# Patient Record
Sex: Male | Born: 1940 | Race: White | Hispanic: No | Marital: Married | Smoking: Former smoker
Health system: Southern US, Community
[De-identification: ages and names within clinical notes are randomized; demographics above are authoritative.]

## PROBLEM LIST (undated history)

## (undated) DIAGNOSIS — I10 Essential (primary) hypertension: Secondary | ICD-10-CM

## (undated) DIAGNOSIS — C801 Malignant (primary) neoplasm, unspecified: Secondary | ICD-10-CM

## (undated) DIAGNOSIS — N529 Male erectile dysfunction, unspecified: Secondary | ICD-10-CM

## (undated) DIAGNOSIS — H9190 Unspecified hearing loss, unspecified ear: Secondary | ICD-10-CM

## (undated) DIAGNOSIS — M1990 Unspecified osteoarthritis, unspecified site: Secondary | ICD-10-CM

## (undated) DIAGNOSIS — I6529 Occlusion and stenosis of unspecified carotid artery: Secondary | ICD-10-CM

## (undated) DIAGNOSIS — G4700 Insomnia, unspecified: Secondary | ICD-10-CM

## (undated) DIAGNOSIS — E785 Hyperlipidemia, unspecified: Secondary | ICD-10-CM

## (undated) DIAGNOSIS — E119 Type 2 diabetes mellitus without complications: Secondary | ICD-10-CM

## (undated) HISTORY — PX: COLONOSCOPY: SHX174

## (undated) HISTORY — PX: HEMORRHOID SURGERY: SHX153

## (undated) HISTORY — DX: Essential (primary) hypertension: I10

## (undated) HISTORY — DX: Type 2 diabetes mellitus without complications: E11.9

## (undated) HISTORY — PX: TONSILLECTOMY: SUR1361

## (undated) HISTORY — DX: Occlusion and stenosis of unspecified carotid artery: I65.29

## (undated) HISTORY — DX: Male erectile dysfunction, unspecified: N52.9

## (undated) HISTORY — DX: Hyperlipidemia, unspecified: E78.5

## (undated) HISTORY — DX: Insomnia, unspecified: G47.00

---

## 2011-04-02 DIAGNOSIS — E782 Mixed hyperlipidemia: Secondary | ICD-10-CM | POA: Diagnosis not present

## 2011-04-02 DIAGNOSIS — Z0000 Encounter for general adult medical examination without abnormal findings: Secondary | ICD-10-CM | POA: Diagnosis not present

## 2011-04-02 DIAGNOSIS — R7309 Other abnormal glucose: Secondary | ICD-10-CM | POA: Diagnosis not present

## 2011-04-02 DIAGNOSIS — I10 Essential (primary) hypertension: Secondary | ICD-10-CM | POA: Diagnosis not present

## 2011-04-02 DIAGNOSIS — N400 Enlarged prostate without lower urinary tract symptoms: Secondary | ICD-10-CM | POA: Diagnosis not present

## 2011-04-09 DIAGNOSIS — I10 Essential (primary) hypertension: Secondary | ICD-10-CM | POA: Diagnosis not present

## 2011-04-09 DIAGNOSIS — E782 Mixed hyperlipidemia: Secondary | ICD-10-CM | POA: Diagnosis not present

## 2011-04-09 DIAGNOSIS — M1990 Unspecified osteoarthritis, unspecified site: Secondary | ICD-10-CM | POA: Diagnosis not present

## 2011-04-09 DIAGNOSIS — M810 Age-related osteoporosis without current pathological fracture: Secondary | ICD-10-CM | POA: Diagnosis not present

## 2011-04-09 DIAGNOSIS — M545 Low back pain: Secondary | ICD-10-CM | POA: Diagnosis not present

## 2011-04-09 DIAGNOSIS — L2089 Other atopic dermatitis: Secondary | ICD-10-CM | POA: Diagnosis not present

## 2011-04-09 DIAGNOSIS — G4700 Insomnia, unspecified: Secondary | ICD-10-CM | POA: Diagnosis not present

## 2011-05-06 DIAGNOSIS — J209 Acute bronchitis, unspecified: Secondary | ICD-10-CM | POA: Diagnosis not present

## 2011-05-06 DIAGNOSIS — J029 Acute pharyngitis, unspecified: Secondary | ICD-10-CM | POA: Diagnosis not present

## 2011-08-04 DIAGNOSIS — E782 Mixed hyperlipidemia: Secondary | ICD-10-CM | POA: Diagnosis not present

## 2011-08-04 DIAGNOSIS — E119 Type 2 diabetes mellitus without complications: Secondary | ICD-10-CM | POA: Diagnosis not present

## 2011-08-13 DIAGNOSIS — E782 Mixed hyperlipidemia: Secondary | ICD-10-CM | POA: Diagnosis not present

## 2011-08-13 DIAGNOSIS — M810 Age-related osteoporosis without current pathological fracture: Secondary | ICD-10-CM | POA: Diagnosis not present

## 2011-08-13 DIAGNOSIS — L2089 Other atopic dermatitis: Secondary | ICD-10-CM | POA: Diagnosis not present

## 2011-08-13 DIAGNOSIS — I10 Essential (primary) hypertension: Secondary | ICD-10-CM | POA: Diagnosis not present

## 2011-08-13 DIAGNOSIS — G4700 Insomnia, unspecified: Secondary | ICD-10-CM | POA: Diagnosis not present

## 2011-08-13 DIAGNOSIS — M545 Low back pain: Secondary | ICD-10-CM | POA: Diagnosis not present

## 2011-08-13 DIAGNOSIS — M1990 Unspecified osteoarthritis, unspecified site: Secondary | ICD-10-CM | POA: Diagnosis not present

## 2011-08-14 DIAGNOSIS — L820 Inflamed seborrheic keratosis: Secondary | ICD-10-CM | POA: Diagnosis not present

## 2011-08-14 DIAGNOSIS — Z85828 Personal history of other malignant neoplasm of skin: Secondary | ICD-10-CM | POA: Diagnosis not present

## 2011-08-14 DIAGNOSIS — D210 Benign neoplasm of connective and other soft tissue of head, face and neck: Secondary | ICD-10-CM | POA: Diagnosis not present

## 2011-08-14 DIAGNOSIS — D2110 Benign neoplasm of connective and other soft tissue of unspecified upper limb, including shoulder: Secondary | ICD-10-CM | POA: Diagnosis not present

## 2011-08-14 DIAGNOSIS — L570 Actinic keratosis: Secondary | ICD-10-CM | POA: Diagnosis not present

## 2011-08-14 DIAGNOSIS — D485 Neoplasm of uncertain behavior of skin: Secondary | ICD-10-CM | POA: Diagnosis not present

## 2011-12-17 DIAGNOSIS — E781 Pure hyperglyceridemia: Secondary | ICD-10-CM | POA: Diagnosis not present

## 2011-12-17 DIAGNOSIS — E782 Mixed hyperlipidemia: Secondary | ICD-10-CM | POA: Diagnosis not present

## 2011-12-17 DIAGNOSIS — E119 Type 2 diabetes mellitus without complications: Secondary | ICD-10-CM | POA: Diagnosis not present

## 2011-12-17 DIAGNOSIS — I10 Essential (primary) hypertension: Secondary | ICD-10-CM | POA: Diagnosis not present

## 2011-12-24 DIAGNOSIS — M1990 Unspecified osteoarthritis, unspecified site: Secondary | ICD-10-CM | POA: Diagnosis not present

## 2011-12-24 DIAGNOSIS — L2089 Other atopic dermatitis: Secondary | ICD-10-CM | POA: Diagnosis not present

## 2011-12-24 DIAGNOSIS — Z23 Encounter for immunization: Secondary | ICD-10-CM | POA: Diagnosis not present

## 2011-12-24 DIAGNOSIS — E782 Mixed hyperlipidemia: Secondary | ICD-10-CM | POA: Diagnosis not present

## 2011-12-24 DIAGNOSIS — M545 Low back pain: Secondary | ICD-10-CM | POA: Diagnosis not present

## 2011-12-24 DIAGNOSIS — I10 Essential (primary) hypertension: Secondary | ICD-10-CM | POA: Diagnosis not present

## 2011-12-24 DIAGNOSIS — G4700 Insomnia, unspecified: Secondary | ICD-10-CM | POA: Diagnosis not present

## 2012-02-16 DIAGNOSIS — Z85828 Personal history of other malignant neoplasm of skin: Secondary | ICD-10-CM | POA: Diagnosis not present

## 2012-02-16 DIAGNOSIS — L570 Actinic keratosis: Secondary | ICD-10-CM | POA: Diagnosis not present

## 2012-02-16 DIAGNOSIS — C44611 Basal cell carcinoma of skin of unspecified upper limb, including shoulder: Secondary | ICD-10-CM | POA: Diagnosis not present

## 2012-02-16 DIAGNOSIS — D485 Neoplasm of uncertain behavior of skin: Secondary | ICD-10-CM | POA: Diagnosis not present

## 2012-02-29 DIAGNOSIS — C44611 Basal cell carcinoma of skin of unspecified upper limb, including shoulder: Secondary | ICD-10-CM | POA: Diagnosis not present

## 2012-02-29 DIAGNOSIS — L570 Actinic keratosis: Secondary | ICD-10-CM | POA: Diagnosis not present

## 2012-04-25 DIAGNOSIS — Z0000 Encounter for general adult medical examination without abnormal findings: Secondary | ICD-10-CM | POA: Diagnosis not present

## 2012-04-25 DIAGNOSIS — E782 Mixed hyperlipidemia: Secondary | ICD-10-CM | POA: Diagnosis not present

## 2012-04-25 DIAGNOSIS — G4700 Insomnia, unspecified: Secondary | ICD-10-CM | POA: Diagnosis not present

## 2012-04-25 DIAGNOSIS — I10 Essential (primary) hypertension: Secondary | ICD-10-CM | POA: Diagnosis not present

## 2012-04-25 DIAGNOSIS — E119 Type 2 diabetes mellitus without complications: Secondary | ICD-10-CM | POA: Diagnosis not present

## 2012-08-16 DIAGNOSIS — D485 Neoplasm of uncertain behavior of skin: Secondary | ICD-10-CM | POA: Diagnosis not present

## 2012-08-16 DIAGNOSIS — Z85828 Personal history of other malignant neoplasm of skin: Secondary | ICD-10-CM | POA: Diagnosis not present

## 2012-08-16 DIAGNOSIS — L570 Actinic keratosis: Secondary | ICD-10-CM | POA: Diagnosis not present

## 2012-08-16 DIAGNOSIS — D2320 Other benign neoplasm of skin of unspecified ear and external auricular canal: Secondary | ICD-10-CM | POA: Diagnosis not present

## 2012-08-16 DIAGNOSIS — D234 Other benign neoplasm of skin of scalp and neck: Secondary | ICD-10-CM | POA: Diagnosis not present

## 2012-08-23 DIAGNOSIS — I10 Essential (primary) hypertension: Secondary | ICD-10-CM | POA: Diagnosis not present

## 2012-08-23 DIAGNOSIS — E782 Mixed hyperlipidemia: Secondary | ICD-10-CM | POA: Diagnosis not present

## 2012-08-23 DIAGNOSIS — E119 Type 2 diabetes mellitus without complications: Secondary | ICD-10-CM | POA: Diagnosis not present

## 2012-08-30 DIAGNOSIS — M1990 Unspecified osteoarthritis, unspecified site: Secondary | ICD-10-CM | POA: Diagnosis not present

## 2012-08-30 DIAGNOSIS — G4700 Insomnia, unspecified: Secondary | ICD-10-CM | POA: Diagnosis not present

## 2012-08-30 DIAGNOSIS — L2089 Other atopic dermatitis: Secondary | ICD-10-CM | POA: Diagnosis not present

## 2012-08-30 DIAGNOSIS — L570 Actinic keratosis: Secondary | ICD-10-CM | POA: Diagnosis not present

## 2012-08-30 DIAGNOSIS — M545 Low back pain: Secondary | ICD-10-CM | POA: Diagnosis not present

## 2012-08-30 DIAGNOSIS — E782 Mixed hyperlipidemia: Secondary | ICD-10-CM | POA: Diagnosis not present

## 2012-08-30 DIAGNOSIS — I10 Essential (primary) hypertension: Secondary | ICD-10-CM | POA: Diagnosis not present

## 2012-09-15 DIAGNOSIS — M19079 Primary osteoarthritis, unspecified ankle and foot: Secondary | ICD-10-CM | POA: Diagnosis not present

## 2012-09-15 DIAGNOSIS — M7730 Calcaneal spur, unspecified foot: Secondary | ICD-10-CM | POA: Diagnosis not present

## 2012-09-15 DIAGNOSIS — M25473 Effusion, unspecified ankle: Secondary | ICD-10-CM | POA: Diagnosis not present

## 2012-09-15 DIAGNOSIS — S93409A Sprain of unspecified ligament of unspecified ankle, initial encounter: Secondary | ICD-10-CM | POA: Diagnosis not present

## 2012-10-31 DIAGNOSIS — M899 Disorder of bone, unspecified: Secondary | ICD-10-CM | POA: Diagnosis not present

## 2012-10-31 DIAGNOSIS — Z87891 Personal history of nicotine dependence: Secondary | ICD-10-CM | POA: Diagnosis not present

## 2012-10-31 DIAGNOSIS — Z8262 Family history of osteoporosis: Secondary | ICD-10-CM | POA: Diagnosis not present

## 2012-12-16 DIAGNOSIS — Z23 Encounter for immunization: Secondary | ICD-10-CM | POA: Diagnosis not present

## 2012-12-28 DIAGNOSIS — I10 Essential (primary) hypertension: Secondary | ICD-10-CM | POA: Diagnosis not present

## 2012-12-28 DIAGNOSIS — E782 Mixed hyperlipidemia: Secondary | ICD-10-CM | POA: Diagnosis not present

## 2013-01-05 DIAGNOSIS — G4700 Insomnia, unspecified: Secondary | ICD-10-CM | POA: Diagnosis not present

## 2013-01-05 DIAGNOSIS — I10 Essential (primary) hypertension: Secondary | ICD-10-CM | POA: Diagnosis not present

## 2013-01-05 DIAGNOSIS — R0989 Other specified symptoms and signs involving the circulatory and respiratory systems: Secondary | ICD-10-CM | POA: Diagnosis not present

## 2013-01-05 DIAGNOSIS — I658 Occlusion and stenosis of other precerebral arteries: Secondary | ICD-10-CM | POA: Diagnosis not present

## 2013-01-05 DIAGNOSIS — M1990 Unspecified osteoarthritis, unspecified site: Secondary | ICD-10-CM | POA: Diagnosis not present

## 2013-01-05 DIAGNOSIS — Z1331 Encounter for screening for depression: Secondary | ICD-10-CM | POA: Diagnosis not present

## 2013-01-05 DIAGNOSIS — E782 Mixed hyperlipidemia: Secondary | ICD-10-CM | POA: Diagnosis not present

## 2013-01-05 DIAGNOSIS — M545 Low back pain: Secondary | ICD-10-CM | POA: Diagnosis not present

## 2013-01-05 DIAGNOSIS — L2089 Other atopic dermatitis: Secondary | ICD-10-CM | POA: Diagnosis not present

## 2013-01-10 ENCOUNTER — Other Ambulatory Visit: Payer: Self-pay | Admitting: *Deleted

## 2013-01-11 ENCOUNTER — Encounter: Payer: Self-pay | Admitting: Vascular Surgery

## 2013-01-18 ENCOUNTER — Encounter: Payer: Self-pay | Admitting: Vascular Surgery

## 2013-01-19 ENCOUNTER — Encounter (INDEPENDENT_AMBULATORY_CARE_PROVIDER_SITE_OTHER): Payer: Self-pay

## 2013-01-19 ENCOUNTER — Ambulatory Visit (HOSPITAL_COMMUNITY)
Admission: RE | Admit: 2013-01-19 | Discharge: 2013-01-19 | Disposition: A | Discharge status: Home / Self Care | Payer: Medicare Other | Source: Ambulatory Visit | Attending: Vascular Surgery | Admitting: Vascular Surgery

## 2013-01-19 ENCOUNTER — Ambulatory Visit (INDEPENDENT_AMBULATORY_CARE_PROVIDER_SITE_OTHER): Payer: Medicare Other | Admitting: Vascular Surgery

## 2013-01-19 ENCOUNTER — Encounter: Payer: Self-pay | Admitting: Vascular Surgery

## 2013-01-19 DIAGNOSIS — I6529 Occlusion and stenosis of unspecified carotid artery: Secondary | ICD-10-CM | POA: Diagnosis not present

## 2013-01-19 DIAGNOSIS — Z01810 Encounter for preprocedural cardiovascular examination: Secondary | ICD-10-CM | POA: Diagnosis not present

## 2013-01-19 NOTE — Progress Notes (Signed)
History of Present Illness:  Patient is a 72 y.o. year old male who presents for evaluation of carotid stenosis.  The patient denies symptoms of TIA, amaurosis, or stroke.  The patient is currently on aspirin antiplatelet therapy.  He is also on a statin.  The carotid stenosis was found on routine surveillance ultrasound.  Other medical problems include hypertension, diabetes, hyperlipidemia.  These are currently stable. He is not on medication for his hypertension. He is also not on medication for his diabetes.  Past Medical History  Diagnosis Date  . Carotid artery occlusion   . Diabetes mellitus without complication   . Hypertension   . Erectile dysfunction   . Hyperlipidemia   . Insomnia     Past Surgical History  Procedure Laterality Date  . Tonsillectomy    . Hemorrhoid surgery       Social History History  Substance Use Topics  . Smoking status: Former Smoker    Quit date: 01/19/1993  . Smokeless tobacco: Not on file  . Alcohol Use: 9.6 - 12 oz/week    2-3 Glasses of wine, 2-3 Cans of beer, 12-14 Shots of liquor per week    Family History Family History  Problem Relation Age of Onset  . Diabetes Father   . Heart disease Father   . Hyperlipidemia Father   . Heart attack Father     Allergies  Allergies  Allergen Reactions  . Pneumococcal Vaccines Other (See Comments)    myalgias  . Bupropion Palpitations     Current Outpatient Prescriptions  Medication Sig Dispense Refill  . alendronate (FOSAMAX) 70 MG tablet Take 70 mg by mouth once a week. Take with a full glass of water on an empty stomach.      . aspirin 81 MG tablet Take 81 mg by mouth daily.      . lovastatin (MEVACOR) 20 MG tablet Take 20 mg by mouth at bedtime. Take two tablets QHS      . Multiple Vitamins-Minerals (MEGA MULTIVITAMIN FOR MEN PO) Take 1 tablet by mouth daily.      . sildenafil (VIAGRA) 100 MG tablet Take 100 mg by mouth daily as needed for erectile dysfunction.      . zolpidem  (AMBIEN) 10 MG tablet Take 10 mg by mouth at bedtime as needed for sleep.      . halobetasol (ULTRAVATE) 0.05 % cream Apply 1 application topically 2 (two) times daily.       No current facility-administered medications for this visit.    ROS:   12 point review of systems all negative  Physical Examination  Filed Vitals:   01/19/13 1457 01/19/13 1501  BP: 189/71 181/71  Pulse: 65   Height: 5' 9" (1.753 m)   Weight: 175 lb 8 oz (79.606 kg)   SpO2: 100%     Body mass index is 25.9 kg/(m^2).  General:  Alert and oriented, no acute distress HEENT: Normal Neck: Right-sided carotid bruit, no left bruit or JVD Pulmonary: Clear to auscultation bilaterally Cardiac: Regular Rate and Rhythm without murmur Gastrointestinal: Soft, non-tender, non-distended, no mass Skin: No rash Extremity Pulses:  2+ radial, brachial, femoral bilaterally Musculoskeletal: No deformity or edema  Neurologic: Upper and lower extremity motor 5/5 and symmetric, no facial asymmetry  DATA: I reviewed the patient's carotid duplex exam from inside imaging dated October 30. This shows a high-grade greater than 80% right internal carotid artery stenosis and less than 50% left internal carotid artery stenosis. We also repeated his duplex   exam in the right side for operative planning purposes in our office today. This confirmed a greater than 80% right internal carotid artery stenosis with bifurcation at the mid hyoid level. I reviewed and interpreted this study.   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 He may need to be started on blood pressure medication if his old pressure is truly running in the 180 systolic. #2 continue aspirin daily including the operation #3 right carotid endarterectomy in the next few weeks pending results of a cardiac stress test. Risks benefits possible  complications and procedure details of carotid endarterectomy were explained the patient and his wife today. These include but are not limited to bleeding infection cranial nerve injury stroke risk of 1-2% also compared to medical management stroke reduction 2-3 fold  Jerome Otter, MD [facility] [place] Office: [phone] Pager: [phone]    Bhakti Labella, MD [facility] [place] Office: [phone] Pager: [phone]   For VQI Use Only    PRE-ADM LIVING: Home  AMB STATUS: Ambulatory  CAD Sx: None  PRIOR CHF: None  STRESS TEST: [ ] No, [ ] Normal, [ ] + ischemia, [ ] + MI, [ ] Both  

## 2013-01-20 ENCOUNTER — Encounter (HOSPITAL_COMMUNITY): Payer: Self-pay | Admitting: Pharmacy Technician

## 2013-01-20 ENCOUNTER — Other Ambulatory Visit: Payer: Self-pay

## 2013-01-20 ENCOUNTER — Encounter: Payer: Self-pay | Admitting: Family Medicine

## 2013-01-23 ENCOUNTER — Ambulatory Visit (HOSPITAL_COMMUNITY): Payer: Medicare Other | Attending: Vascular Surgery | Admitting: Radiology

## 2013-01-23 DIAGNOSIS — E785 Hyperlipidemia, unspecified: Secondary | ICD-10-CM | POA: Insufficient documentation

## 2013-01-23 DIAGNOSIS — I491 Atrial premature depolarization: Secondary | ICD-10-CM | POA: Diagnosis not present

## 2013-01-23 DIAGNOSIS — E119 Type 2 diabetes mellitus without complications: Secondary | ICD-10-CM | POA: Insufficient documentation

## 2013-01-23 DIAGNOSIS — Z87891 Personal history of nicotine dependence: Secondary | ICD-10-CM | POA: Insufficient documentation

## 2013-01-23 DIAGNOSIS — I10 Essential (primary) hypertension: Secondary | ICD-10-CM | POA: Diagnosis not present

## 2013-01-23 DIAGNOSIS — R002 Palpitations: Secondary | ICD-10-CM | POA: Diagnosis not present

## 2013-01-23 DIAGNOSIS — Z8249 Family history of ischemic heart disease and other diseases of the circulatory system: Secondary | ICD-10-CM | POA: Diagnosis not present

## 2013-01-23 DIAGNOSIS — I779 Disorder of arteries and arterioles, unspecified: Secondary | ICD-10-CM | POA: Insufficient documentation

## 2013-01-23 DIAGNOSIS — Z01810 Encounter for preprocedural cardiovascular examination: Secondary | ICD-10-CM

## 2013-01-23 MED ORDER — TECHNETIUM TC 99M SESTAMIBI GENERIC - CARDIOLITE
10.0000 | Freq: Once | INTRAVENOUS | Status: AC | PRN
  Administered 2013-01-23: 10 via INTRAVENOUS

## 2013-01-23 MED ORDER — TECHNETIUM TC 99M SESTAMIBI GENERIC - CARDIOLITE
30.0000 | Freq: Once | INTRAVENOUS | Status: AC | PRN
  Administered 2013-01-23: 30 via INTRAVENOUS

## 2013-01-23 MED ORDER — REGADENOSON 0.4 MG/5ML IV SOLN
0.4000 mg | Freq: Once | INTRAVENOUS | Status: AC
  Administered 2013-01-23: 0.4 mg via INTRAVENOUS

## 2013-01-23 NOTE — Progress Notes (Signed)
[facility] SITE 3 NUCLEAR MED [address] [place], [place] [address] [phone]    Cardiology Nuclear Med Study  Terrance Brown is a 72 y.o. male     MRN : [id number]     DOB: 29-May-1940  Procedure Date: 01/23/2013  Nuclear Med Background Indication for Stress Test:  Evaluation for Ischemia and Surgical Clearance- 01/30/13 R [facility]- Dr. Fabienne Bruns History:  GXT: NL Cardiac Risk Factors: Carotid Disease, Family History - CAD, History of Smoking, Hypertension, Lipids and NIDDM  Symptoms:  Palpitations   Nuclear Pre-Procedure Caffeine/Decaff Intake:  None NPO After: 9:00pm   Lungs:  clear O2 Sat: 96% on room air. IV 0.9% NS with Angio Cath:  22g  IV Site: L Hand  IV Started by:  Bonnita Levan, RN  Chest Size (in):  42 Cup Size: n/a  Height: 5\' 9"  (1.753 m)  Weight:  174 lb (78.926 kg)  BMI:  Body mass index is 25.68 kg/(m^2). Tech Comments:  N/A    Nuclear Med Study 1 or 2 day study: 1 day  Stress Test Type:  Lexiscan  Reading MD: Tobias Alexander, MD  Order Authorizing Provider:  Fabienne Bruns, MD  Resting Radionuclide: Technetium 19m Sestamibi  Resting Radionuclide Dose: 11.0 mCi   Stress Radionuclide:  Technetium 17m Sestamibi  Stress Radionuclide Dose: 33.0 mCi           Stress Protocol Rest HR: 59 Stress HR: 84  Rest BP: 158/81 Stress BP: 190/65  Exercise Time (min): n/a METS: n/a   Predicted Max HR: 146 bpm % Max HR: 57.53 bpm Rate Pressure Product: 21308   Dose of Adenosine (mg):  n/a Dose of Lexiscan: 0.4 mg  Dose of Atropine (mg): n/a Dose of Dobutamine: n/a mcg/kg/min (at max HR)  Stress Test Technologist: Milana Na, EMT-P  Nuclear Technologist:  Domenic Polite, CNMT     Rest Procedure:  Myocardial perfusion imaging was performed at rest 45 minutes following the intravenous administration of Technetium 68m Sestamibi. Rest ECG: Sinus bradycardia, PACs  Stress Procedure:  The patient received IV Lexiscan 0.4 mg over 15-seconds.   Technetium 66m Sestamibi injected at 30-seconds. This patient had sob and abdominal pain with the Lexiscan injection. Quantitative spect images were obtained after a 45 minute delay. Stress ECG: No significant change from baseline ECG  QPS Raw Data Images:  Normal; no motion artifact; normal heart/lung ratio. Stress Images:  Normal homogeneous uptake in all areas of the myocardium. Rest Images:  Normal homogeneous uptake in all areas of the myocardium. Subtraction (SDS):  No evidence of ischemia. Transient Ischemic Dilatation (Normal <1.22):  0.97 Lung/Heart Ratio (Normal <0.45):  0.25  Quantitative Gated Spect Images QGS EDV:  89 ml QGS ESV:  35 ml  Impression Exercise Capacity:  Lexiscan with no exercise. BP Response:  Hypertensive blood pressure response. Clinical Symptoms:  There is dyspnea. ECG Impression:  No significant ST segment change suggestive of ischemia. Comparison with Prior Nuclear Study: No previous nuclear study performed  Overall Impression:  Normal stress nuclear study.  LV Ejection Fraction: 61%.  LV Wall Motion:  NL LV Function; NL Wall Motion  Tobias Alexander, Rexene Edison 01/23/2013

## 2013-01-25 ENCOUNTER — Encounter (HOSPITAL_COMMUNITY)
Admission: RE | Admit: 2013-01-25 | Discharge: 2013-01-25 | Disposition: A | Discharge status: Home / Self Care | Payer: Medicare Other | Source: Ambulatory Visit | Attending: Vascular Surgery | Admitting: Vascular Surgery

## 2013-01-25 ENCOUNTER — Encounter (HOSPITAL_COMMUNITY): Payer: Self-pay

## 2013-01-25 DIAGNOSIS — Z01810 Encounter for preprocedural cardiovascular examination: Secondary | ICD-10-CM | POA: Insufficient documentation

## 2013-01-25 DIAGNOSIS — Z01812 Encounter for preprocedural laboratory examination: Secondary | ICD-10-CM | POA: Diagnosis not present

## 2013-01-25 DIAGNOSIS — Z01818 Encounter for other preprocedural examination: Secondary | ICD-10-CM | POA: Insufficient documentation

## 2013-01-25 DIAGNOSIS — I10 Essential (primary) hypertension: Secondary | ICD-10-CM | POA: Diagnosis not present

## 2013-01-25 HISTORY — DX: Malignant (primary) neoplasm, unspecified: C80.1

## 2013-01-25 HISTORY — DX: Unspecified hearing loss, unspecified ear: H91.90

## 2013-01-25 HISTORY — DX: Unspecified osteoarthritis, unspecified site: M19.90

## 2013-01-25 LAB — TYPE AND SCREEN
ABO/RH(D): O POS
Antibody Screen: NEGATIVE

## 2013-01-25 LAB — COMPREHENSIVE METABOLIC PANEL
ALT: 21 U/L (ref 0–53)
AST: 24 U/L (ref 0–37)
Alkaline Phosphatase: 66 U/L (ref 39–117)
CO2: 26 mEq/L (ref 19–32)
Calcium: 9.5 mg/dL (ref 8.4–10.5)
Chloride: 105 mEq/L (ref 96–112)
Glucose, Bld: 122 mg/dL — ABNORMAL HIGH (ref 70–99)
Potassium: 4.8 mEq/L (ref 3.5–5.1)
Sodium: 141 mEq/L (ref 135–145)
Total Protein: 7.3 g/dL (ref 6.0–8.3)

## 2013-01-25 LAB — URINALYSIS, ROUTINE W REFLEX MICROSCOPIC
Leukocytes, UA: NEGATIVE
Nitrite: NEGATIVE
Protein, ur: NEGATIVE mg/dL
Specific Gravity, Urine: 1.026 (ref 1.005–1.030)
Urobilinogen, UA: 1 mg/dL (ref 0.0–1.0)

## 2013-01-25 LAB — CBC
Hemoglobin: 14.7 g/dL (ref 13.0–17.0)
MCH: 31.8 pg (ref 26.0–34.0)
MCHC: 34.4 g/dL (ref 30.0–36.0)
Platelets: 141 10*3/uL — ABNORMAL LOW (ref 150–400)
RBC: 4.62 MIL/uL (ref 4.22–5.81)
RDW: 12.4 % (ref 11.5–15.5)

## 2013-01-25 LAB — ABO/RH: ABO/RH(D): O POS

## 2013-01-25 LAB — SURGICAL PCR SCREEN
MRSA, PCR: NEGATIVE
Staphylococcus aureus: NEGATIVE

## 2013-01-25 LAB — APTT: aPTT: 31 seconds (ref 24–37)

## 2013-01-25 NOTE — Pre-Procedure Instructions (Signed)
Terrance Brown  01/25/2013   Your procedure is scheduled on:  Monday January 30, 2013.  Report to [facility] Short Stay Entrance "A" Admitting at 5:30 AM.  Call this number if you have problems the morning of surgery: [phone]   Remember:   Do not eat food or drink liquids after midnight.   Take these medicines the morning of surgery with A SIP OF WATER: NONE   Do not wear jewelry.  Do not wear lotions, powders, or colognes. You may wear deodorant.  Men may shave face and neck.  Do not bring valuables to the hospital.  [facility] is not responsible for any belongings or valuables.               Contacts, dentures or bridgework may not be worn into surgery.  Leave suitcase in the car. After surgery it may be brought to your room.  For patients admitted to the hospital, discharge time is determined by your treatment team.               Patients discharged the day of surgery will not be allowed to drive home.  Name and phone number of your driver:   Special Instructions: Shower using CHG 2 nights before surgery and the night before surgery.  If you shower the day of surgery use CHG.  Use special wash - you have one bottle of CHG for all showers.  You should use approximately 1/3 of the bottle for each shower.   Please read over the following fact sheets that you were given: Pain Booklet, Coughing and Deep Breathing, Blood Transfusion Information, MRSA Information and Surgical Site Infection Prevention

## 2013-01-25 NOTE — Progress Notes (Signed)
Patient denied having any cardiac or pulmonary issues. Patient had a stress test on 01/19/13; encounter in [facility]. Patient stated "since I was having surgery my doctor thought it be best that I have a stress test first." PCP is Dr. Donzetta Sprung in [place], [place]. Patient informed Nurse that he has been having a elevated blood pressure, however he has not been prescribed to take any medication for it. Wife at chair side during PAT.

## 2013-01-29 MED ORDER — DEXTROSE 5 % IV SOLN
1.5000 g | INTRAVENOUS | Status: AC
  Administered 2013-01-30: 1.5 g via INTRAVENOUS
  Filled 2013-01-29: qty 1.5

## 2013-01-30 ENCOUNTER — Telehealth: Payer: Self-pay | Admitting: Vascular Surgery

## 2013-01-30 ENCOUNTER — Encounter (HOSPITAL_COMMUNITY): Payer: Medicare Other | Admitting: Critical Care Medicine

## 2013-01-30 ENCOUNTER — Encounter (HOSPITAL_COMMUNITY)
Admission: RE | Disposition: A | Discharge status: Home / Self Care | Payer: Self-pay | Source: Ambulatory Visit | Attending: Vascular Surgery

## 2013-01-30 ENCOUNTER — Inpatient Hospital Stay (HOSPITAL_COMMUNITY): Payer: Medicare Other | Admitting: Critical Care Medicine

## 2013-01-30 ENCOUNTER — Inpatient Hospital Stay (HOSPITAL_COMMUNITY)
Admission: RE | Admit: 2013-01-30 | Discharge: 2013-01-31 | DRG: 039 | Disposition: A | Discharge status: Home / Self Care | Payer: Medicare Other | Source: Ambulatory Visit | Attending: Vascular Surgery | Admitting: Vascular Surgery

## 2013-01-30 ENCOUNTER — Encounter (HOSPITAL_COMMUNITY): Payer: Self-pay | Admitting: Critical Care Medicine

## 2013-01-30 DIAGNOSIS — Z7982 Long term (current) use of aspirin: Secondary | ICD-10-CM

## 2013-01-30 DIAGNOSIS — Z833 Family history of diabetes mellitus: Secondary | ICD-10-CM | POA: Diagnosis not present

## 2013-01-30 DIAGNOSIS — I658 Occlusion and stenosis of other precerebral arteries: Secondary | ICD-10-CM | POA: Diagnosis not present

## 2013-01-30 DIAGNOSIS — H9190 Unspecified hearing loss, unspecified ear: Secondary | ICD-10-CM | POA: Diagnosis present

## 2013-01-30 DIAGNOSIS — I6529 Occlusion and stenosis of unspecified carotid artery: Secondary | ICD-10-CM | POA: Diagnosis not present

## 2013-01-30 DIAGNOSIS — Z87891 Personal history of nicotine dependence: Secondary | ICD-10-CM

## 2013-01-30 DIAGNOSIS — I10 Essential (primary) hypertension: Secondary | ICD-10-CM | POA: Diagnosis not present

## 2013-01-30 DIAGNOSIS — E119 Type 2 diabetes mellitus without complications: Secondary | ICD-10-CM | POA: Diagnosis present

## 2013-01-30 DIAGNOSIS — M129 Arthropathy, unspecified: Secondary | ICD-10-CM | POA: Diagnosis present

## 2013-01-30 DIAGNOSIS — E785 Hyperlipidemia, unspecified: Secondary | ICD-10-CM | POA: Diagnosis present

## 2013-01-30 HISTORY — PX: ENDARTERECTOMY: SHX5162

## 2013-01-30 HISTORY — PX: PATCH ANGIOPLASTY: SHX6230

## 2013-01-30 LAB — GLUCOSE, CAPILLARY
Glucose-Capillary: 130 mg/dL — ABNORMAL HIGH (ref 70–99)
Glucose-Capillary: 135 mg/dL — ABNORMAL HIGH (ref 70–99)
Glucose-Capillary: 114 mg/dL — ABNORMAL HIGH (ref 70–99)
Glucose-Capillary: 130 mg/dL — ABNORMAL HIGH (ref 70–99)
Glucose-Capillary: 154 mg/dL — ABNORMAL HIGH (ref 70–99)

## 2013-01-30 SURGERY — ENDARTERECTOMY, CAROTID
Anesthesia: General | Site: Neck | Laterality: Right | Wound class: Clean

## 2013-01-30 MED ORDER — FENTANYL CITRATE 0.05 MG/ML IJ SOLN
INTRAMUSCULAR | Status: DC | PRN
  Administered 2013-01-30 (×4): 50 ug via INTRAVENOUS
  Administered 2013-01-30: 100 ug via INTRAVENOUS

## 2013-01-30 MED ORDER — MORPHINE SULFATE 2 MG/ML IJ SOLN
2.0000 mg | INTRAMUSCULAR | Status: DC | PRN
  Administered 2013-01-30: 2 mg via INTRAVENOUS
  Filled 2013-01-30: qty 1

## 2013-01-30 MED ORDER — NEOSTIGMINE METHYLSULFATE 1 MG/ML IJ SOLN
INTRAMUSCULAR | Status: DC | PRN
  Administered 2013-01-30: 3 mg via INTRAVENOUS

## 2013-01-30 MED ORDER — HEPARIN SODIUM (PORCINE) 1000 UNIT/ML IJ SOLN
INTRAMUSCULAR | Status: DC | PRN
  Administered 2013-01-30: 8000 [IU] via INTRAVENOUS

## 2013-01-30 MED ORDER — ASPIRIN EC 81 MG PO TBEC
81.0000 mg | DELAYED_RELEASE_TABLET | Freq: Every day | ORAL | Status: DC
  Administered 2013-01-31: 81 mg via ORAL
  Filled 2013-01-30: qty 1

## 2013-01-30 MED ORDER — MIDAZOLAM HCL 5 MG/5ML IJ SOLN
INTRAMUSCULAR | Status: DC | PRN
  Administered 2013-01-30: 1 mg via INTRAVENOUS

## 2013-01-30 MED ORDER — DEXTROSE 5 % IV SOLN
1.5000 g | Freq: Two times a day (BID) | INTRAVENOUS | Status: AC
  Administered 2013-01-30 – 2013-01-31 (×2): 1.5 g via INTRAVENOUS
  Filled 2013-01-30 (×2): qty 1.5

## 2013-01-30 MED ORDER — SODIUM CHLORIDE 0.9 % IR SOLN
Status: DC | PRN
  Administered 2013-01-30: 08:00:00

## 2013-01-30 MED ORDER — OXYCODONE-ACETAMINOPHEN 5-325 MG PO TABS: 1.0000 | ORAL_TABLET | ORAL | Status: AC | PRN

## 2013-01-30 MED ORDER — LIDOCAINE HCL (CARDIAC) 20 MG/ML IV SOLN
INTRAVENOUS | Status: DC | PRN
  Administered 2013-01-30: 50 mg via INTRAVENOUS

## 2013-01-30 MED ORDER — SODIUM CHLORIDE 0.9 % IV SOLN: INTRAVENOUS | Status: DC

## 2013-01-30 MED ORDER — LACTATED RINGERS IV SOLN
INTRAVENOUS | Status: DC | PRN
  Administered 2013-01-30 (×2): via INTRAVENOUS

## 2013-01-30 MED ORDER — PROPOFOL 10 MG/ML IV BOLUS
INTRAVENOUS | Status: DC | PRN
  Administered 2013-01-30: 140 mg via INTRAVENOUS

## 2013-01-30 MED ORDER — HYDROMORPHONE HCL PF 1 MG/ML IJ SOLN: 0.2500 mg | INTRAMUSCULAR | Status: DC | PRN

## 2013-01-30 MED ORDER — HYDRALAZINE HCL 20 MG/ML IJ SOLN: 10.0000 mg | INTRAMUSCULAR | Status: DC | PRN

## 2013-01-30 MED ORDER — OXYCODONE-ACETAMINOPHEN 5-325 MG PO TABS
1.0000 | ORAL_TABLET | ORAL | Status: DC | PRN
  Administered 2013-01-31: 1 via ORAL
  Filled 2013-01-30: qty 1

## 2013-01-30 MED ORDER — DEXTROSE-NACL 5-0.9 % IV SOLN: INTRAVENOUS | Status: DC

## 2013-01-30 MED ORDER — LACTATED RINGERS IV SOLN: INTRAVENOUS | Status: DC | PRN

## 2013-01-30 MED ORDER — ROCURONIUM BROMIDE 100 MG/10ML IV SOLN
INTRAVENOUS | Status: DC | PRN
  Administered 2013-01-30: 10 mg via INTRAVENOUS
  Administered 2013-01-30: 40 mg via INTRAVENOUS

## 2013-01-30 MED ORDER — SIMVASTATIN 10 MG PO TABS
10.0000 mg | ORAL_TABLET | Freq: Every day | ORAL | Status: DC
  Administered 2013-01-30: 10 mg via ORAL
  Filled 2013-01-30 (×2): qty 1

## 2013-01-30 MED ORDER — ALUM & MAG HYDROXIDE-SIMETH 200-200-20 MG/5ML PO SUSP: 15.0000 mL | ORAL | Status: DC | PRN

## 2013-01-30 MED ORDER — ONDANSETRON HCL 4 MG/2ML IJ SOLN
INTRAMUSCULAR | Status: DC | PRN
  Administered 2013-01-30: 4 mg via INTRAVENOUS

## 2013-01-30 MED ORDER — SODIUM CHLORIDE 0.9 % IV SOLN
INTRAVENOUS | Status: DC
  Administered 2013-01-30 (×2): via INTRAVENOUS

## 2013-01-30 MED ORDER — PHENOL 1.4 % MT LIQD: 1.0000 | OROMUCOSAL | Status: DC | PRN

## 2013-01-30 MED ORDER — ACETAMINOPHEN 650 MG RE SUPP: 325.0000 mg | RECTAL | Status: DC | PRN

## 2013-01-30 MED ORDER — PROTAMINE SULFATE 10 MG/ML IV SOLN
INTRAVENOUS | Status: DC | PRN
  Administered 2013-01-30: 80 mg via INTRAVENOUS

## 2013-01-30 MED ORDER — CYCLOBENZAPRINE HCL 10 MG PO TABS
10.0000 mg | ORAL_TABLET | Freq: Three times a day (TID) | ORAL | Status: DC | PRN
  Administered 2013-01-31: 10 mg via ORAL
  Filled 2013-01-30: qty 1

## 2013-01-30 MED ORDER — POTASSIUM CHLORIDE CRYS ER 20 MEQ PO TBCR: 20.0000 meq | EXTENDED_RELEASE_TABLET | Freq: Once | ORAL | Status: AC | PRN

## 2013-01-30 MED ORDER — THROMBIN 20000 UNITS EX SOLR
CUTANEOUS | Status: AC
  Filled 2013-01-30: qty 20000

## 2013-01-30 MED ORDER — ONDANSETRON HCL 4 MG/2ML IJ SOLN: 4.0000 mg | Freq: Once | INTRAMUSCULAR | Status: DC | PRN

## 2013-01-30 MED ORDER — GLYCOPYRROLATE 0.2 MG/ML IJ SOLN
INTRAMUSCULAR | Status: DC | PRN
  Administered 2013-01-30: .4 mg via INTRAVENOUS

## 2013-01-30 MED ORDER — PANTOPRAZOLE SODIUM 40 MG PO TBEC
40.0000 mg | DELAYED_RELEASE_TABLET | Freq: Every day | ORAL | Status: DC
  Administered 2013-01-30 – 2013-01-31 (×2): 40 mg via ORAL
  Filled 2013-01-30 (×2): qty 1

## 2013-01-30 MED ORDER — ACETAMINOPHEN 325 MG PO TABS: 325.0000 mg | ORAL_TABLET | ORAL | Status: DC | PRN

## 2013-01-30 MED ORDER — GUAIFENESIN-DM 100-10 MG/5ML PO SYRP: 15.0000 mL | ORAL_SOLUTION | ORAL | Status: DC | PRN

## 2013-01-30 MED ORDER — LIDOCAINE HCL (PF) 1 % IJ SOLN
INTRAMUSCULAR | Status: AC
  Filled 2013-01-30: qty 30

## 2013-01-30 MED ORDER — DOCUSATE SODIUM 100 MG PO CAPS
100.0000 mg | ORAL_CAPSULE | Freq: Every day | ORAL | Status: DC
  Administered 2013-01-31: 100 mg via ORAL
  Filled 2013-01-30: qty 1

## 2013-01-30 MED ORDER — METOPROLOL TARTRATE 1 MG/ML IV SOLN: 2.0000 mg | INTRAVENOUS | Status: DC | PRN

## 2013-01-30 MED ORDER — ONDANSETRON HCL 4 MG/2ML IJ SOLN: 4.0000 mg | Freq: Four times a day (QID) | INTRAMUSCULAR | Status: DC | PRN

## 2013-01-30 MED ORDER — PHENYLEPHRINE HCL 10 MG/ML IJ SOLN
10.0000 mg | INTRAVENOUS | Status: DC | PRN
  Administered 2013-01-30: 25 ug/min via INTRAVENOUS

## 2013-01-30 MED ORDER — LABETALOL HCL 5 MG/ML IV SOLN: 10.0000 mg | INTRAVENOUS | Status: DC | PRN

## 2013-01-30 MED ORDER — EPHEDRINE SULFATE 50 MG/ML IJ SOLN
INTRAMUSCULAR | Status: DC | PRN
  Administered 2013-01-30: 2.5 mg via INTRAVENOUS
  Administered 2013-01-30: 10 mg via INTRAVENOUS

## 2013-01-30 MED ORDER — SODIUM CHLORIDE 0.9 % IV SOLN
500.0000 mL | Freq: Once | INTRAVENOUS | Status: AC | PRN
  Administered 2013-01-30: 500 mL via INTRAVENOUS

## 2013-01-30 MED ORDER — ARTIFICIAL TEARS OP OINT
TOPICAL_OINTMENT | OPHTHALMIC | Status: DC | PRN
  Administered 2013-01-30: 1 via OPHTHALMIC

## 2013-01-30 MED ORDER — 0.9 % SODIUM CHLORIDE (POUR BTL) OPTIME
TOPICAL | Status: DC | PRN
  Administered 2013-01-30: 2000 mL

## 2013-01-30 MED ORDER — LABETALOL HCL 5 MG/ML IV SOLN
INTRAVENOUS | Status: DC | PRN
  Administered 2013-01-30: 5 mg via INTRAVENOUS

## 2013-01-30 SURGICAL SUPPLY — 50 items
CANISTER SUCTION 2500CC (MISCELLANEOUS) ×3 IMPLANT
CANNULA VESSEL 3MM 2 BLNT TIP (CANNULA) ×3 IMPLANT
CATH ROBINSON RED A/P 18FR (CATHETERS) ×3 IMPLANT
CLIP TI MEDIUM 6 (CLIP) ×3 IMPLANT
CLIP TI WIDE RED SMALL 6 (CLIP) ×3 IMPLANT
COVER SURGICAL LIGHT HANDLE (MISCELLANEOUS) ×3 IMPLANT
CRADLE DONUT ADULT HEAD (MISCELLANEOUS) ×3 IMPLANT
DECANTER SPIKE VIAL GLASS SM (MISCELLANEOUS) IMPLANT
DERMABOND ADVANCED (GAUZE/BANDAGES/DRESSINGS) ×1
DERMABOND ADVANCED .7 DNX12 (GAUZE/BANDAGES/DRESSINGS) ×2 IMPLANT
DRAIN HEMOVAC 1/8 X 5 (WOUND CARE) IMPLANT
DRAPE WARM FLUID 44X44 (DRAPE) ×3 IMPLANT
ELECT REM PT RETURN 9FT ADLT (ELECTROSURGICAL) ×3
ELECTRODE REM PT RTRN 9FT ADLT (ELECTROSURGICAL) ×2 IMPLANT
EVACUATOR SILICONE 100CC (DRAIN) IMPLANT
GEL ULTRASOUND 20GR AQUASONIC (MISCELLANEOUS) IMPLANT
GLOVE BIO SURGEON STRL SZ7.5 (GLOVE) ×3 IMPLANT
GLOVE BIOGEL PI IND STRL 6.5 (GLOVE) ×4 IMPLANT
GLOVE BIOGEL PI IND STRL 7.0 (GLOVE) ×6 IMPLANT
GLOVE BIOGEL PI INDICATOR 6.5 (GLOVE) ×2
GLOVE BIOGEL PI INDICATOR 7.0 (GLOVE) ×3
GLOVE SS BIOGEL STRL SZ 6.5 (GLOVE) ×2 IMPLANT
GLOVE SUPERSENSE BIOGEL SZ 6.5 (GLOVE) ×1
GLOVE SURG SS PI 6.5 STRL IVOR (GLOVE) ×6 IMPLANT
GLOVE SURG SS PI 7.0 STRL IVOR (GLOVE) ×3 IMPLANT
GOWN PREVENTION PLUS XLARGE (GOWN DISPOSABLE) ×3 IMPLANT
GOWN STRL NON-REIN LRG LVL3 (GOWN DISPOSABLE) ×12 IMPLANT
KIT BASIN OR (CUSTOM PROCEDURE TRAY) ×3 IMPLANT
KIT ROOM TURNOVER OR (KITS) ×3 IMPLANT
LOOP VESSEL MINI RED (MISCELLANEOUS) ×3 IMPLANT
NEEDLE HYPO 25GX1X1/2 BEV (NEEDLE) ×3 IMPLANT
NS IRRIG 1000ML POUR BTL (IV SOLUTION) ×6 IMPLANT
PACK CAROTID (CUSTOM PROCEDURE TRAY) ×3 IMPLANT
PAD ARMBOARD 7.5X6 YLW CONV (MISCELLANEOUS) ×6 IMPLANT
PATCH HEMASHIELD 8X75 (Vascular Products) ×3 IMPLANT
SHUNT CAROTID BYPASS 10 (VASCULAR PRODUCTS) ×3 IMPLANT
SHUNT CAROTID BYPASS 12FRX15.5 (VASCULAR PRODUCTS) IMPLANT
SPONGE SURGIFOAM ABS GEL 100 (HEMOSTASIS) IMPLANT
SUT ETHILON 3 0 PS 1 (SUTURE) IMPLANT
SUT PROLENE 6 0 CC (SUTURE) ×3 IMPLANT
SUT PROLENE 7 0 BV 1 (SUTURE) IMPLANT
SUT SILK 3 0 TIES 17X18 (SUTURE)
SUT SILK 3-0 18XBRD TIE BLK (SUTURE) IMPLANT
SUT VIC AB 3-0 SH 27 (SUTURE) ×1
SUT VIC AB 3-0 SH 27X BRD (SUTURE) ×2 IMPLANT
SUT VICRYL 4-0 PS2 18IN ABS (SUTURE) ×3 IMPLANT
SYR CONTROL 10ML LL (SYRINGE) IMPLANT
TOWEL OR 17X24 6PK STRL BLUE (TOWEL DISPOSABLE) ×3 IMPLANT
TOWEL OR 17X26 10 PK STRL BLUE (TOWEL DISPOSABLE) ×3 IMPLANT
WATER STERILE IRR 1000ML POUR (IV SOLUTION) ×3 IMPLANT

## 2013-01-30 NOTE — Discharge Summary (Signed)
[facility] Discharge Summary   Patient ID:  Terrance Brown MRN: [id number] DOB/AGE: 1941-03-08 72 y.o.  Admit date: 01/30/2013 Discharge date: 01/30/2013 Date of Surgery: 01/30/2013 Surgeon: Surgeon(s): Sherren Kerns, MD  Admission Diagnosis: Right Internal Carotid Artery Stenosis  Discharge Diagnoses:  Right Internal Carotid Artery Stenosis  Secondary Diagnoses: Past Medical History  Diagnosis Date  . Carotid artery occlusion   . Diabetes mellitus without complication   . Hypertension   . Erectile dysfunction   . Hyperlipidemia   . Insomnia   . Cancer     skin CA removed from neck,ear, back, hand  . Arthritis   . HOH (hard of hearing)     Procedure(s): ENDARTERECTOMY CAROTID-RIGHT RIGHT CAROTID ARTERY PATCH ANGIOPLASTY USING HEMASHIELD PATCH  Discharged Condition: good  HPI:  Terrance Brown is a 72 y.o. male who presents for evaluation of carotid stenosis. The patient denies symptoms of TIA, amaurosis, or stroke. The patient is currently on aspirin antiplatelet therapy. He is also on a statin. The carotid stenosis was found on routine surveillance ultrasound. Other medical problems include hypertension, diabetes, hyperlipidemia. These are currently stable. He is not on medication for his hypertension. He is also not on medication for his diabetes. carotid duplex exam from inside imaging dated October 30. This shows a high-grade greater than 80% right internal carotid artery stenosis and less than 50% left internal carotid artery stenosis. We also repeated his duplex exam in the right side for operative planning purposes in our office today. This confirmed a greater than 80% right internal carotid artery stenosis with bifurcation at the mid hyoid level Pt had cardiac clearance and is admitted for right carotid endarterectomy  Hospital Course:  Terrance Brown is a 72 y.o. male is S/P Procedure(s): ENDARTERECTOMY CAROTID-RIGHT RIGHT CAROTID ARTERY  PATCH ANGIOPLASTY USING HEMASHIELD PATCH Extubated: POD # 0 Physical exam: Pt is A&O x 3 Gait is normal Speech is fluent right Neck Wound is healing well Patient with Negative tongue deviation and Negative facial droop Pt has good and equal strength in all extremities  Post-op wounds healing well Pt. Ambulating, voiding and taking PO diet without difficulty. Pt pain controlled with PO pain meds. Labs as below Complications:none  Consults:     Significant Diagnostic Studies: CBC Lab Results  Component Value Date   WBC 5.8 01/25/2013   HGB 14.7 01/25/2013   HCT 42.7 01/25/2013   MCV 92.4 01/25/2013   PLT 141* 01/25/2013    BMET    Component Value Date/Time   NA 141 01/25/2013 1320   K 4.8 01/25/2013 1320   CL 105 01/25/2013 1320   CO2 26 01/25/2013 1320   GLUCOSE 122* 01/25/2013 1320   BUN 21 01/25/2013 1320   CREATININE 0.98 01/25/2013 1320   CALCIUM 9.5 01/25/2013 1320   GFRNONAA 79* 01/25/2013 1320   GFRAA >90 01/25/2013 1320   COAG Lab Results  Component Value Date   INR 0.89 01/25/2013     Disposition:  Discharge to :Home Discharge Orders   Future Appointments Provider Department Dept Phone   02/16/2013 8:45 AM Sherren Kerns, MD [facility] [phone]   Future Orders Complete By Expires   Call MD for:  redness, tenderness, or signs of infection (pain, swelling, bleeding, redness, odor or green/yellow discharge around incision site)  As directed    Call MD for:  severe or increased pain, loss or decreased feeling  in affected limb(s)  As directed  Call MD for:  temperature >100.5  As directed    CAROTID Sugery: Call MD for difficulty swallowing or speaking; weakness in arms or legs that is a new symtom; severe headache.  If you have increased swelling in the neck and/or  are having difficulty breathing, CALL [phone]  As directed    Driving Restrictions  As directed    Comments:     No driving for 2 weeks   Increase  activity slowly  As directed    Comments:     Walk with assistance use Glahn or cane as needed   Lifting restrictions  As directed    Comments:     No lifting for 4 weeks   May shower   As directed    Scheduling Instructions:     Wednesday   may wash over wound with mild soap and water  As directed    No dressing needed  As directed    Resume previous diet  As directed        Medication List         alendronate 70 MG tablet  Commonly known as:  FOSAMAX  Take 70 mg by mouth once a week. Take with a full glass of water on an empty stomach on Fridays     ALEVE 220 MG tablet  Generic drug:  naproxen sodium  Take 220 mg by mouth daily.     aspirin EC 81 MG tablet  Take 81 mg by mouth daily.     cholecalciferol 1000 UNITS tablet  Commonly known as:  VITAMIN D  Take 1,000 Units by mouth daily.     lovastatin 20 MG tablet  Commonly known as:  MEVACOR  Take 40 mg by mouth daily after supper.     multivitamin with minerals Tabs tablet  Take 1 tablet by mouth daily.     oxyCODONE-acetaminophen 5-325 MG per tablet  Commonly known as:  ROXICET  Take 1-2 tablets by mouth every 4 (four) hours as needed for severe pain.     sildenafil 100 MG tablet  Commonly known as:  VIAGRA  Take 100 mg by mouth daily as needed for erectile dysfunction.     zolpidem 10 MG tablet  Commonly known as:  AMBIEN  Take 10 mg by mouth at bedtime.       Verbal and written Discharge instructions given to the patient. Wound care per Discharge AVS     Follow-up Information   Follow up with Sherren Kerns, MD In 2 weeks. (office will arrange-sent)    Specialty:  Vascular Surgery   Contact information:   [address] [place] [address] [phone]       Signed: Marlowe Shores 01/30/2013, 9:08 PM  --- For VQI Registry use --- Instructions: Press F2 to tab through selections.  Delete question if not applicable.   Modified Rankin score at D/C (0-6): Rankin Score=0  IV medication  needed for:  1. Hypertension: No 2. Hypotension: No  Post-op Complications: No  1. Post-op CVA or TIA: {yes/no:20286  2. CN injury: No  3. Myocardial infarction: No  4.  CHF: No  5.  Dysrhythmia (new): No  6. Wound infection: No  7. Reperfusion symptoms: No  8. Return to OR: No  Discharge medications: Statin use:  Yes ASA use:  Yes Beta blocker use:  No  for medical reason not indicated ACE-Inhibitor use: No  for medical reason not indicated P2Y12 Antagonist use: [x ] None, [ ]  Plavix, [ ]   Plasugrel, [ ]  Ticlopinine, [ ]  Ticagrelor, [ ]  Other, [ ]  No for medical reason, [ ]  Non-compliant, [ ]  Not-indicated Anti-coagulant use:  x[ ]  None, [ ]  Warfarin, [ ]  Rivaroxaban, [ ]  Dabigatran, [ ]  Other, [ ]  No for medical reason, [ ]  Non-compliant, [ ]  Not-indicated

## 2013-01-30 NOTE — Op Note (Signed)
Procedure Right carotid endarterectomy  Preoperative diagnosis: High-grade asymptomatic right internal carotid artery stenosis  Postoperative diagnosis: Same  Anesthesia General  Asst.: Della Goo, [facility]  Operative findings: #1 greater than 80% right internal carotid stenosis, small internal                                                              #2 Dacron patch           #3 10 Fr shunt  Operative details: After obtaining informed consent, the patient was taken to the operating room. The patient was placed in a supine position on the operating room table. After induction of general anesthesia and endotracheal intubation a Foley catheter was placed. Next the patient's entire neck and chest was prepped and draped in the usual sterile fashion. An oblique incision was made on the right aspect of the patient's neck anterior to the border the right sternocleidomastoid muscle. The incision was carried into the subcutaneous tissues and through the platysma. The sternocleidomastoid muscle was identified and reflected laterally.  The common carotid artery was then found at the base of the incision this was dissected free circumferentially. It was fairly soft on palpation.  The vagus nerve was identified and protected. Dissection was then carried up to the level carotid bifurcation.   The hyperglossal nerve was above the primary area of dissection. The internal carotid artery was dissected free circumferentially just below the level of the hypoglossal nerve and it was soft in character at this location and above any palpable disease. A vessel loop was placed around this. Next the external carotid and superior thyroid arteries were dissected free circumferentially and vessel loops were placed around these. The patient was given 8000 units of intravenous heparin.  After 2 minutes of circulation time and raising the mean arterial pressure to 90 mm mercury, the distal internal carotid artery was controlled  with small bulldog clamp. The external carotid and superior thyroid arteries were controlled with vessel loops. The common carotid artery was controlled with a peripheral DeBakey clamp. A longitudinal opening was made in the common carotid artery just below the bifurcation. The arteriotomy was extended distally up into the internal carotid with Potts scissors. There was a large calcified plaque with greater than 80% stenosis in the internal carotid. The 10 Fr  was threaded into the distal internal carotid artery and allowed to backbleed thoroughly.  There was pulsatile backbleeding.  This was then threaded into the common carotid and secured with a Rummel tourniquet.  There was no air at this point and flow was restored to the brain.  Attention was then turned to the common carotid artery once again. A suitable endarterectomy plane was obtained and endarterectomy was begun in the common carotid artery and a good proximal endpoint was obtained. An eversion endarterectomy was performed on the external carotid artery and a good endpoint was obtained. The plaque was then elevated in the internal carotid artery and a nice feathered distal endpoint was also obtained.  The plaque was passed off the table. All loose debris was then removed from the carotid bed and everything was thoroughly irrigated with heparinized saline. A Dacron patch was then brought on to the operative field and this was sewn on as a patch angioplasty using a running 6-0  Prolene suture. Prior to completion of the anastomosis the internal carotid artery was thoroughly backbled. This was then controlled again with a fine bulldog clamp.  The common carotid was thoroughly flushed forward. The external carotid was also thoroughly backbled.  The remainder of the patch was completed and the anastomosis was secured. Flow was then restored first retrograde from the external carotid into the carotid bed then antegrade from the common carotid to the external  carotid artery and after approximately 5 cardiac cycles to the internal carotid artery. Doppler was used to evaluate the external/internal and common carotid arteries and these all had good Doppler flow. Hemostasis was obtained with 2 additional repair sutures in the proximal patch. The patient was also given 80 mg of Protamine.      The platysma muscle was reapproximated using a running 3-0 Vicryl suture. The skin was closed with 4 0 Vicryl subcuticular stitch.  The patient was awakened in the operating room and was moving upper and lower extremities symmetrically and following commands.  The patient was stable on arrival to the PACU.  Fabienne Bruns, MD [facility] [place] Office: [phone] Pager: [phone]

## 2013-01-30 NOTE — Preoperative (Signed)
Beta Blockers   Reason not to administer Beta Blockers:Not Applicable 

## 2013-01-30 NOTE — Interval H&P Note (Signed)
History and Physical Interval Note:  01/30/2013 7:29 AM  Terrance Brown  has presented today for surgery, with the diagnosis of Right Internal Carotid Artery Stenosis  The various methods of treatment have been discussed with the patient and family. After consideration of risks, benefits and other options for treatment, the patient has consented to  Procedure(s): ENDARTERECTOMY CAROTID-RIGHT (Right) as a surgical intervention .  The patient's history has been reviewed, patient examined, no change in status, stable for surgery.  I have reviewed the patient's chart and labs.  Questions were answered to the patient's satisfaction.     Gerrard Crystal E

## 2013-01-30 NOTE — Telephone Encounter (Addendum)
Message copied by Fredrich Birks on Mon Jan 30, 2013 11:31 AM ------      Message from: Marlowe Shores      Created: Mon Jan 30, 2013 10:08 AM       2 week carotid F/U - Fields ------  01/30/13: lm for pt with family mem.

## 2013-01-30 NOTE — H&P (View-Only) (Signed)
History of Present Illness:  Patient is a 72 y.o. year old male who presents for evaluation of carotid stenosis.  The patient denies symptoms of TIA, amaurosis, or stroke.  The patient is currently on aspirin antiplatelet therapy.  He is also on a statin.  The carotid stenosis was found on routine surveillance ultrasound.  Other medical problems include hypertension, diabetes, hyperlipidemia.  These are currently stable. He is not on medication for his hypertension. He is also not on medication for his diabetes.  Past Medical History  Diagnosis Date  . Carotid artery occlusion   . Diabetes mellitus without complication   . Hypertension   . Erectile dysfunction   . Hyperlipidemia   . Insomnia     Past Surgical History  Procedure Laterality Date  . Tonsillectomy    . Hemorrhoid surgery       Social History History  Substance Use Topics  . Smoking status: Former Smoker    Quit date: 01/19/1993  . Smokeless tobacco: Not on file  . Alcohol Use: 9.6 - 12 oz/week    2-3 Glasses of wine, 2-3 Cans of beer, 12-14 Shots of liquor per week    Family History Family History  Problem Relation Age of Onset  . Diabetes Father   . Heart disease Father   . Hyperlipidemia Father   . Heart attack Father     Allergies  Allergies  Allergen Reactions  . Pneumococcal Vaccines Other (See Comments)    myalgias  . Bupropion Palpitations     Current Outpatient Prescriptions  Medication Sig Dispense Refill  . alendronate (FOSAMAX) 70 MG tablet Take 70 mg by mouth once a week. Take with a full glass of water on an empty stomach.      Marland Kitchen aspirin 81 MG tablet Take 81 mg by mouth daily.      Marland Kitchen lovastatin (MEVACOR) 20 MG tablet Take 20 mg by mouth at bedtime. Take two tablets QHS      . Multiple Vitamins-Minerals (MEGA MULTIVITAMIN FOR MEN PO) Take 1 tablet by mouth daily.      . sildenafil (VIAGRA) 100 MG tablet Take 100 mg by mouth daily as needed for erectile dysfunction.      Marland Kitchen zolpidem  (AMBIEN) 10 MG tablet Take 10 mg by mouth at bedtime as needed for sleep.      . halobetasol (ULTRAVATE) 0.05 % cream Apply 1 application topically 2 (two) times daily.       No current facility-administered medications for this visit.    ROS:   12 point review of systems all negative  Physical Examination  Filed Vitals:   01/19/13 1457 01/19/13 1501  BP: 189/71 181/71  Pulse: 65   Height: 5\' 9"  (1.753 m)   Weight: 175 lb 8 oz (79.606 kg)   SpO2: 100%     Body mass index is 25.9 kg/(m^2).  General:  Alert and oriented, no acute distress HEENT: Normal Neck: Right-sided carotid bruit, no left bruit or JVD Pulmonary: Clear to auscultation bilaterally Cardiac: Regular Rate and Rhythm without murmur Gastrointestinal: Soft, non-tender, non-distended, no mass Skin: No rash Extremity Pulses:  2+ radial, brachial, femoral bilaterally Musculoskeletal: No deformity or edema  Neurologic: Upper and lower extremity motor 5/5 and symmetric, no facial asymmetry  DATA: I reviewed the patient's carotid duplex exam from inside imaging dated October 30. This shows a high-grade greater than 80% right internal carotid artery stenosis and less than 50% left internal carotid artery stenosis. We also repeated his duplex  exam in the right side for operative planning purposes in our office today. This confirmed a greater than 80% right internal carotid artery stenosis with bifurcation at the mid hyoid level. I reviewed and interpreted this study.   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 He may need to be started on blood pressure medication if his old pressure is truly running in the 180 systolic. #2 continue aspirin daily including the operation #3 right carotid endarterectomy in the next few weeks pending results of a cardiac stress test. Risks benefits possible  complications and procedure details of carotid endarterectomy were explained the patient and his wife today. These include but are not limited to bleeding infection cranial nerve injury stroke risk of 1-2% also compared to medical management stroke reduction 2-3 fold  Fabienne Bruns, MD [facility] [place] Office: [phone] Pager: [phone]    Fabienne Bruns, MD [facility] [place] Office: [phone] Pager: [phone]   For VQI Use Only    PRE-ADM LIVING: Home  AMB STATUS: Ambulatory  CAD Sx: None  PRIOR CHF: None  STRESS TEST: [ ]  No, [ ]  Normal, [ ]  + ischemia, [ ]  + MI, [ ]  Both

## 2013-01-30 NOTE — Progress Notes (Signed)
Utilization review completed.  

## 2013-01-30 NOTE — Progress Notes (Signed)
MD at bedside, states we will monitor BP off the A-line.

## 2013-01-30 NOTE — Anesthesia Postprocedure Evaluation (Signed)
  Anesthesia Post-op Note  Patient: Terrance Brown  Procedure(s) Performed: Procedure(s): ENDARTERECTOMY CAROTID-RIGHT (Right) RIGHT CAROTID ARTERY PATCH ANGIOPLASTY USING HEMASHIELD PATCH (Right)  Patient Location: PACU  Anesthesia Type:General  Level of Consciousness: awake, alert  and oriented  Airway and Oxygen Therapy: Patient Spontanous Breathing and Patient connected to nasal cannula oxygen  Post-op Pain: mild  Post-op Assessment: Post-op Vital signs reviewed, Patient's Cardiovascular Status Stable, Respiratory Function Stable, Patent Airway, No signs of Nausea or vomiting and Pain level controlled  Post-op Vital Signs: stable  Complications: No apparent anesthesia complications

## 2013-01-30 NOTE — Anesthesia Procedure Notes (Signed)
Procedure Name: Intubation Date/Time: 01/30/2013 7:42 AM Performed by: Elon Alas Pre-anesthesia Checklist: Patient identified, Timeout performed, Emergency Drugs available, Suction available and Patient being monitored Patient Re-evaluated:Patient Re-evaluated prior to inductionOxygen Delivery Method: Circle system utilized Preoxygenation: Pre-oxygenation with 100% oxygen Intubation Type: IV induction Ventilation: Mask ventilation without difficulty and Oral airway inserted - appropriate to patient size Laryngoscope Size: Miller and 2 Grade View: Grade I Tube type: Oral Tube size: 7.5 mm Number of attempts: 1 Airway Equipment and Method: Stylet Placement Confirmation: positive ETCO2,  ETT inserted through vocal cords under direct vision and breath sounds checked- equal and bilateral Secured at: 23 cm Tube secured with: Tape Dental Injury: Teeth and Oropharynx as per pre-operative assessment

## 2013-01-30 NOTE — Progress Notes (Signed)
Pt having uncontrolled upper leg spasms.  Pt with short period of relief following administration of morphine, but then spasms returned.  Doreatha Massed PA notified, orders received for flexeril.  Will continue to monitor.   Roselie Awkward, RN

## 2013-01-30 NOTE — Progress Notes (Signed)
Pt stated he fell last night.  Pt has 1 inch abrasion on left ankle that is dry and a small bruise on cheek bone and a large 4 inch abrasion on left arm that is scraped, bruised and oozing.  4X4 tegaderm applied over site.

## 2013-01-30 NOTE — Transfer of Care (Signed)
Immediate Anesthesia Transfer of Care Note  Patient: Terrance Brown  Procedure(s) Performed: Procedure(s): ENDARTERECTOMY CAROTID-RIGHT (Right) RIGHT CAROTID ARTERY PATCH ANGIOPLASTY USING HEMASHIELD PATCH (Right)  Patient Location: PACU  Anesthesia Type:General  Level of Consciousness: awake, alert  and oriented  Airway & Oxygen Therapy: Patient Spontanous Breathing and Patient connected to nasal cannula oxygen  Post-op Assessment: Report given to PACU RN, Post -op Vital signs reviewed and stable and Patient moving all extremities X 4  Post vital signs: Reviewed and stable  Complications: No apparent anesthesia complications

## 2013-01-30 NOTE — Anesthesia Preprocedure Evaluation (Addendum)
Anesthesia Evaluation  Patient identified by MRN, date of birth, ID band Patient awake    Airway Mallampati: III TM Distance: >3 FB Neck ROM: Full    Dental  (+) Dental Advisory Given and Teeth Intact   Pulmonary former smoker,  breath sounds clear to auscultation        Cardiovascular hypertension, + Peripheral Vascular Disease Rhythm:Regular Rate:Normal     Neuro/Psych    GI/Hepatic   Endo/Other  diabetes  Renal/GU      Musculoskeletal  (+) Arthritis -,   Abdominal   Peds  Hematology   Anesthesia Other Findings   Reproductive/Obstetrics                        Anesthesia Physical Anesthesia Plan  ASA: III  Anesthesia Plan: General   Post-op Pain Management:    Induction: Intravenous  Airway Management Planned: Oral ETT  Additional Equipment: Arterial line  Intra-op Plan:   Post-operative Plan: Extubation in OR  Informed Consent: I have reviewed the patients History and Physical, chart, labs and discussed the procedure including the risks, benefits and alternatives for the proposed anesthesia with the patient or authorized representative who has indicated his/her understanding and acceptance.   Dental advisory given  Plan Discussed with: Anesthesiologist, Surgeon and CRNA  Anesthesia Plan Comments:        Anesthesia Quick Evaluation

## 2013-01-30 NOTE — Progress Notes (Signed)
Patient has a small area to the left upper arm that is an abrasion from a fall prior to coming to the hospital. Tegaderm applied. No drainage.

## 2013-01-31 LAB — CBC
MCH: 31.3 pg (ref 26.0–34.0)
MCHC: 33.3 g/dL (ref 30.0–36.0)
MCV: 93.8 fL (ref 78.0–100.0)
Platelets: 106 10*3/uL — ABNORMAL LOW (ref 150–400)
RDW: 12.6 % (ref 11.5–15.5)

## 2013-01-31 LAB — BASIC METABOLIC PANEL
Calcium: 7.5 mg/dL — ABNORMAL LOW (ref 8.4–10.5)
Creatinine, Ser: 0.94 mg/dL (ref 0.50–1.35)
GFR calc Af Amer: >90 mL/min (ref >90)
GFR calc non Af Amer: 80 mL/min — ABNORMAL LOW (ref >90)
Glucose, Bld: 118 mg/dL — ABNORMAL HIGH (ref 70–99)
Potassium: 4 mEq/L (ref 3.5–5.1)
Sodium: 138 mEq/L (ref 135–145)

## 2013-01-31 LAB — GLUCOSE, CAPILLARY: Glucose-Capillary: 90 mg/dL (ref 70–99)

## 2013-01-31 NOTE — Progress Notes (Addendum)
[facility] [place]  Subjective  - No new complaints and no right sided neck complaints.  He is unable to void independently and has a history of enlarged prostate.  He has not taken medication for this in the past.  He has been in and out catheterized times 2.     Objective 102/45 53 98.7 F (37.1 C) (Oral) 16 96%  Intake/Output Summary (Last 24 hours) at 01/31/13 0738 Last data filed at 01/31/13 0645  Gross per 24 hour  Intake 3698.33 ml  Output   2100 ml  Net 1598.33 ml    Bilateral upper extremities N/V/M intact. No facial droop.  No tongue deviation. 5/5 grip strength. Speech is fluent.  Assessment/Planning: Procedure(s): ENDARTERECTOMY CAROTID-RIGHT RIGHT CAROTID ARTERY PATCH ANGIOPLASTY USING HEMASHIELD PATCH  1 Day Post-OpSurgeon(s): Sherren Kerns, MD   Pending independent voiding Will re-eval today   Clinton Gallant [facility] 01/31/2013 7:38 AM --  Laboratory Lab Results:  Recent Labs  01/31/13 0435  WBC 6.3  HGB 10.5*  HCT 31.5*  PLT 106*   BMET  Recent Labs  01/31/13 0435  NA 138  K 4.0  CL 107  CO2 23  GLUCOSE 118*  BUN 16  CREATININE 0.94  CALCIUM 7.5*    COAG Lab Results  Component Value Date   INR 0.89 01/25/2013   No results found for this basename: PTT   Some aches and pains in back/legs. Swallow intact, tongue midline, symmetric motor strength upper and lower No hematoma Overall doing well s/p CEA home later today  Fabienne Bruns, MD [facility] [place] Office: [phone] Pager: [phone]

## 2013-01-31 NOTE — Progress Notes (Signed)
Pt voiding and ambulating without difficulty.  Discharge instructions given to pt and wife.  Both verbalized understanding with all questions answered.  Pt discharged to home with wife.  Roselie Awkward, RN

## 2013-02-01 ENCOUNTER — Encounter (HOSPITAL_COMMUNITY): Payer: Self-pay | Admitting: Vascular Surgery

## 2013-02-14 DIAGNOSIS — Z85828 Personal history of other malignant neoplasm of skin: Secondary | ICD-10-CM | POA: Diagnosis not present

## 2013-02-14 DIAGNOSIS — L570 Actinic keratosis: Secondary | ICD-10-CM | POA: Diagnosis not present

## 2013-02-15 ENCOUNTER — Encounter: Payer: Self-pay | Admitting: Vascular Surgery

## 2013-02-16 ENCOUNTER — Encounter: Payer: Self-pay | Admitting: Vascular Surgery

## 2013-02-16 ENCOUNTER — Ambulatory Visit (INDEPENDENT_AMBULATORY_CARE_PROVIDER_SITE_OTHER): Payer: Self-pay | Admitting: Vascular Surgery

## 2013-02-16 DIAGNOSIS — I6529 Occlusion and stenosis of unspecified carotid artery: Secondary | ICD-10-CM

## 2013-02-16 NOTE — Progress Notes (Signed)
[facility] [place] [facility] HISTORY AND PHYSICAL    History of Present Illness:  Patient is a 72 y.o. year old male who presents for post-operative follow-up after right carotid endarterectomy.  Denies headaches, numbness, tingling or other neuro deficits.  No swallowing problems.  No incisional drainage.  He states that his blood pressure has been a little more elevated recently.   Physical Examination  Filed Vitals:   02/16/13 0902  BP: 176/77  Pulse:     Body mass index is 25.68 kg/(m^2).  General:  Alert and oriented, no acute distress Neck: No bruit or JVD, healing right neck incision Skin: No rash  Neurologic: Upper and lower extremity motor 5/5 and symmetric  ASSESSMENT: Doing well status post right carotid endarterectomy, prior duplex shows less than 50% stenosis on the contralateral side.   PLAN: The patient will have a followup carotid duplex scan in 6 months time. He will to see if he needs a medication adjustments for his blood pressure. He will continue his antiplatelet in the form of aspirin.   Fabienne Bruns, MD [facility] [place] Office: [phone] Pager: [phone]

## 2013-03-06 DIAGNOSIS — M9981 Other biomechanical lesions of cervical region: Secondary | ICD-10-CM | POA: Diagnosis not present

## 2013-03-06 DIAGNOSIS — M4712 Other spondylosis with myelopathy, cervical region: Secondary | ICD-10-CM | POA: Diagnosis not present

## 2013-03-07 DIAGNOSIS — M9981 Other biomechanical lesions of cervical region: Secondary | ICD-10-CM | POA: Diagnosis not present

## 2013-03-07 DIAGNOSIS — M4712 Other spondylosis with myelopathy, cervical region: Secondary | ICD-10-CM | POA: Diagnosis not present

## 2013-03-08 DIAGNOSIS — M9981 Other biomechanical lesions of cervical region: Secondary | ICD-10-CM | POA: Diagnosis not present

## 2013-03-08 DIAGNOSIS — M4712 Other spondylosis with myelopathy, cervical region: Secondary | ICD-10-CM | POA: Diagnosis not present

## 2013-03-10 DIAGNOSIS — M9981 Other biomechanical lesions of cervical region: Secondary | ICD-10-CM | POA: Diagnosis not present

## 2013-03-10 DIAGNOSIS — K2100 Gastro-esophageal reflux disease with esophagitis, without bleeding: Secondary | ICD-10-CM | POA: Diagnosis not present

## 2013-03-10 DIAGNOSIS — M4712 Other spondylosis with myelopathy, cervical region: Secondary | ICD-10-CM | POA: Diagnosis not present

## 2013-03-10 DIAGNOSIS — L2089 Other atopic dermatitis: Secondary | ICD-10-CM | POA: Diagnosis not present

## 2013-03-10 DIAGNOSIS — G4700 Insomnia, unspecified: Secondary | ICD-10-CM | POA: Diagnosis not present

## 2013-03-10 DIAGNOSIS — M545 Low back pain, unspecified: Secondary | ICD-10-CM | POA: Diagnosis not present

## 2013-03-10 DIAGNOSIS — I10 Essential (primary) hypertension: Secondary | ICD-10-CM | POA: Diagnosis not present

## 2013-03-10 DIAGNOSIS — M1990 Unspecified osteoarthritis, unspecified site: Secondary | ICD-10-CM | POA: Diagnosis not present

## 2013-03-10 DIAGNOSIS — E782 Mixed hyperlipidemia: Secondary | ICD-10-CM | POA: Diagnosis not present

## 2013-03-10 DIAGNOSIS — L570 Actinic keratosis: Secondary | ICD-10-CM | POA: Diagnosis not present

## 2013-04-18 DIAGNOSIS — B0229 Other postherpetic nervous system involvement: Secondary | ICD-10-CM | POA: Diagnosis not present

## 2013-04-28 DIAGNOSIS — I10 Essential (primary) hypertension: Secondary | ICD-10-CM | POA: Diagnosis not present

## 2013-04-28 DIAGNOSIS — IMO0001 Reserved for inherently not codable concepts without codable children: Secondary | ICD-10-CM | POA: Diagnosis not present

## 2013-04-28 DIAGNOSIS — E782 Mixed hyperlipidemia: Secondary | ICD-10-CM | POA: Diagnosis not present

## 2013-04-28 DIAGNOSIS — K2100 Gastro-esophageal reflux disease with esophagitis, without bleeding: Secondary | ICD-10-CM | POA: Diagnosis not present

## 2013-05-05 DIAGNOSIS — M1990 Unspecified osteoarthritis, unspecified site: Secondary | ICD-10-CM | POA: Diagnosis not present

## 2013-05-05 DIAGNOSIS — Z0000 Encounter for general adult medical examination without abnormal findings: Secondary | ICD-10-CM | POA: Diagnosis not present

## 2013-05-05 DIAGNOSIS — I10 Essential (primary) hypertension: Secondary | ICD-10-CM | POA: Diagnosis not present

## 2013-05-05 DIAGNOSIS — E119 Type 2 diabetes mellitus without complications: Secondary | ICD-10-CM | POA: Diagnosis not present

## 2013-05-05 DIAGNOSIS — E782 Mixed hyperlipidemia: Secondary | ICD-10-CM | POA: Diagnosis not present

## 2013-05-05 DIAGNOSIS — G4700 Insomnia, unspecified: Secondary | ICD-10-CM | POA: Diagnosis not present

## 2013-05-05 DIAGNOSIS — M545 Low back pain, unspecified: Secondary | ICD-10-CM | POA: Diagnosis not present

## 2013-05-05 DIAGNOSIS — K2100 Gastro-esophageal reflux disease with esophagitis, without bleeding: Secondary | ICD-10-CM | POA: Diagnosis not present

## 2013-05-26 DIAGNOSIS — H2510 Age-related nuclear cataract, unspecified eye: Secondary | ICD-10-CM | POA: Diagnosis not present

## 2013-08-15 DIAGNOSIS — Z85828 Personal history of other malignant neoplasm of skin: Secondary | ICD-10-CM | POA: Diagnosis not present

## 2013-08-15 DIAGNOSIS — L570 Actinic keratosis: Secondary | ICD-10-CM | POA: Diagnosis not present

## 2013-08-16 ENCOUNTER — Encounter: Payer: Self-pay | Admitting: Vascular Surgery

## 2013-08-17 ENCOUNTER — Encounter: Payer: Self-pay | Admitting: Vascular Surgery

## 2013-08-17 ENCOUNTER — Ambulatory Visit (HOSPITAL_COMMUNITY)
Admission: RE | Admit: 2013-08-17 | Discharge: 2013-08-17 | Disposition: A | Discharge status: Home / Self Care | Payer: Medicare Other | Source: Ambulatory Visit | Attending: Vascular Surgery | Admitting: Vascular Surgery

## 2013-08-17 ENCOUNTER — Ambulatory Visit (INDEPENDENT_AMBULATORY_CARE_PROVIDER_SITE_OTHER): Payer: Medicare Other | Admitting: Vascular Surgery

## 2013-08-17 VITALS — BP 138/65 | HR 67 | Resp 18 | Ht 68.5 in | Wt 166.0 lb

## 2013-08-17 DIAGNOSIS — I6529 Occlusion and stenosis of unspecified carotid artery: Secondary | ICD-10-CM | POA: Insufficient documentation

## 2013-08-17 NOTE — Progress Notes (Signed)
Patient is a 73 year old male who returns today for followup after right carotid endarterectomy November 2014. He denies any symptoms of TIA amaurosis or stroke. She overall is very active and continues to work. He is currently taking aspirin 81 mg daily. Chronic medical problems include elevated cholesterol for which he is on lovastatin.  Review of systems: He denies shortness of breath or chest pain.  Current Outpatient Prescriptions on File Prior to Visit  Medication Sig Dispense Refill  . alendronate (FOSAMAX) 70 MG tablet Take 70 mg by mouth once a week. Take with a full glass of water on an empty stomach on Fridays      . aspirin EC 81 MG tablet Take 81 mg by mouth daily.      . cholecalciferol (VITAMIN D) 1000 UNITS tablet Take 1,000 Units by mouth daily.      . Multiple Vitamin (MULTIVITAMIN WITH MINERALS) TABS tablet Take 1 tablet by mouth daily.      . naproxen sodium (ALEVE) 220 MG tablet Take 220 mg by mouth daily.      . sildenafil (VIAGRA) 100 MG tablet Take 100 mg by mouth daily as needed for erectile dysfunction.      Marland Kitchen zolpidem (AMBIEN) 10 MG tablet Take 10 mg by mouth at bedtime.       . lovastatin (MEVACOR) 20 MG tablet Take 40 mg by mouth daily after supper.       Marland Kitchen oxyCODONE-acetaminophen (ROXICET) 5-325 MG per tablet Take 1-2 tablets by mouth every 4 (four) hours as needed for severe pain.  30 tablet  0   No current facility-administered medications on file prior to visit.   Physical exam:  Filed Vitals:   08/17/13 1525 08/17/13 1528  BP: 142/58 138/65  Pulse: 64 67  Resp: 18   Height: 5' 8.5" (1.74 m)   Weight: 166 lb (75.297 kg)     Neck: Well-healed right neck scar no carotid bruits  Neuro: Symmetric upper extremity and lower extremity motor strength which is 5 over 5  Chest: Clear to auscultation bilaterally  Cardiac: Regular rate and rhythm without murmur  Data: Patient had a carotid duplex exam today. This shows no recurrent stenosis on the right side  less than 40% stenosis on the left side bilateral vertebral flow is antegrade. I reviewed and interpreted this study.  Assessment: Status post previous right carotid endarterectomy no recurrent stenosis  Plan: Followup carotid duplex scan one year continue aspirin therapy  Ruta Hinds, MD [facility] [place] Office: [phone] Pager: [phone]

## 2013-08-17 NOTE — Addendum Note (Signed)
Addended by: Mena Goes on: 08/17/2013 04:29 PM   Modules accepted: Orders

## 2013-10-30 DIAGNOSIS — K644 Residual hemorrhoidal skin tags: Secondary | ICD-10-CM | POA: Diagnosis not present

## 2013-12-22 DIAGNOSIS — Z23 Encounter for immunization: Secondary | ICD-10-CM | POA: Diagnosis not present

## 2014-01-07 IMAGING — CR DG CHEST 2V
2 series · 2 of 2 positions shown · non-contrast
Comparison: None.

CLINICAL DATA: Hypertension.

EXAM:
CHEST  2 VIEW

[w chest pa]
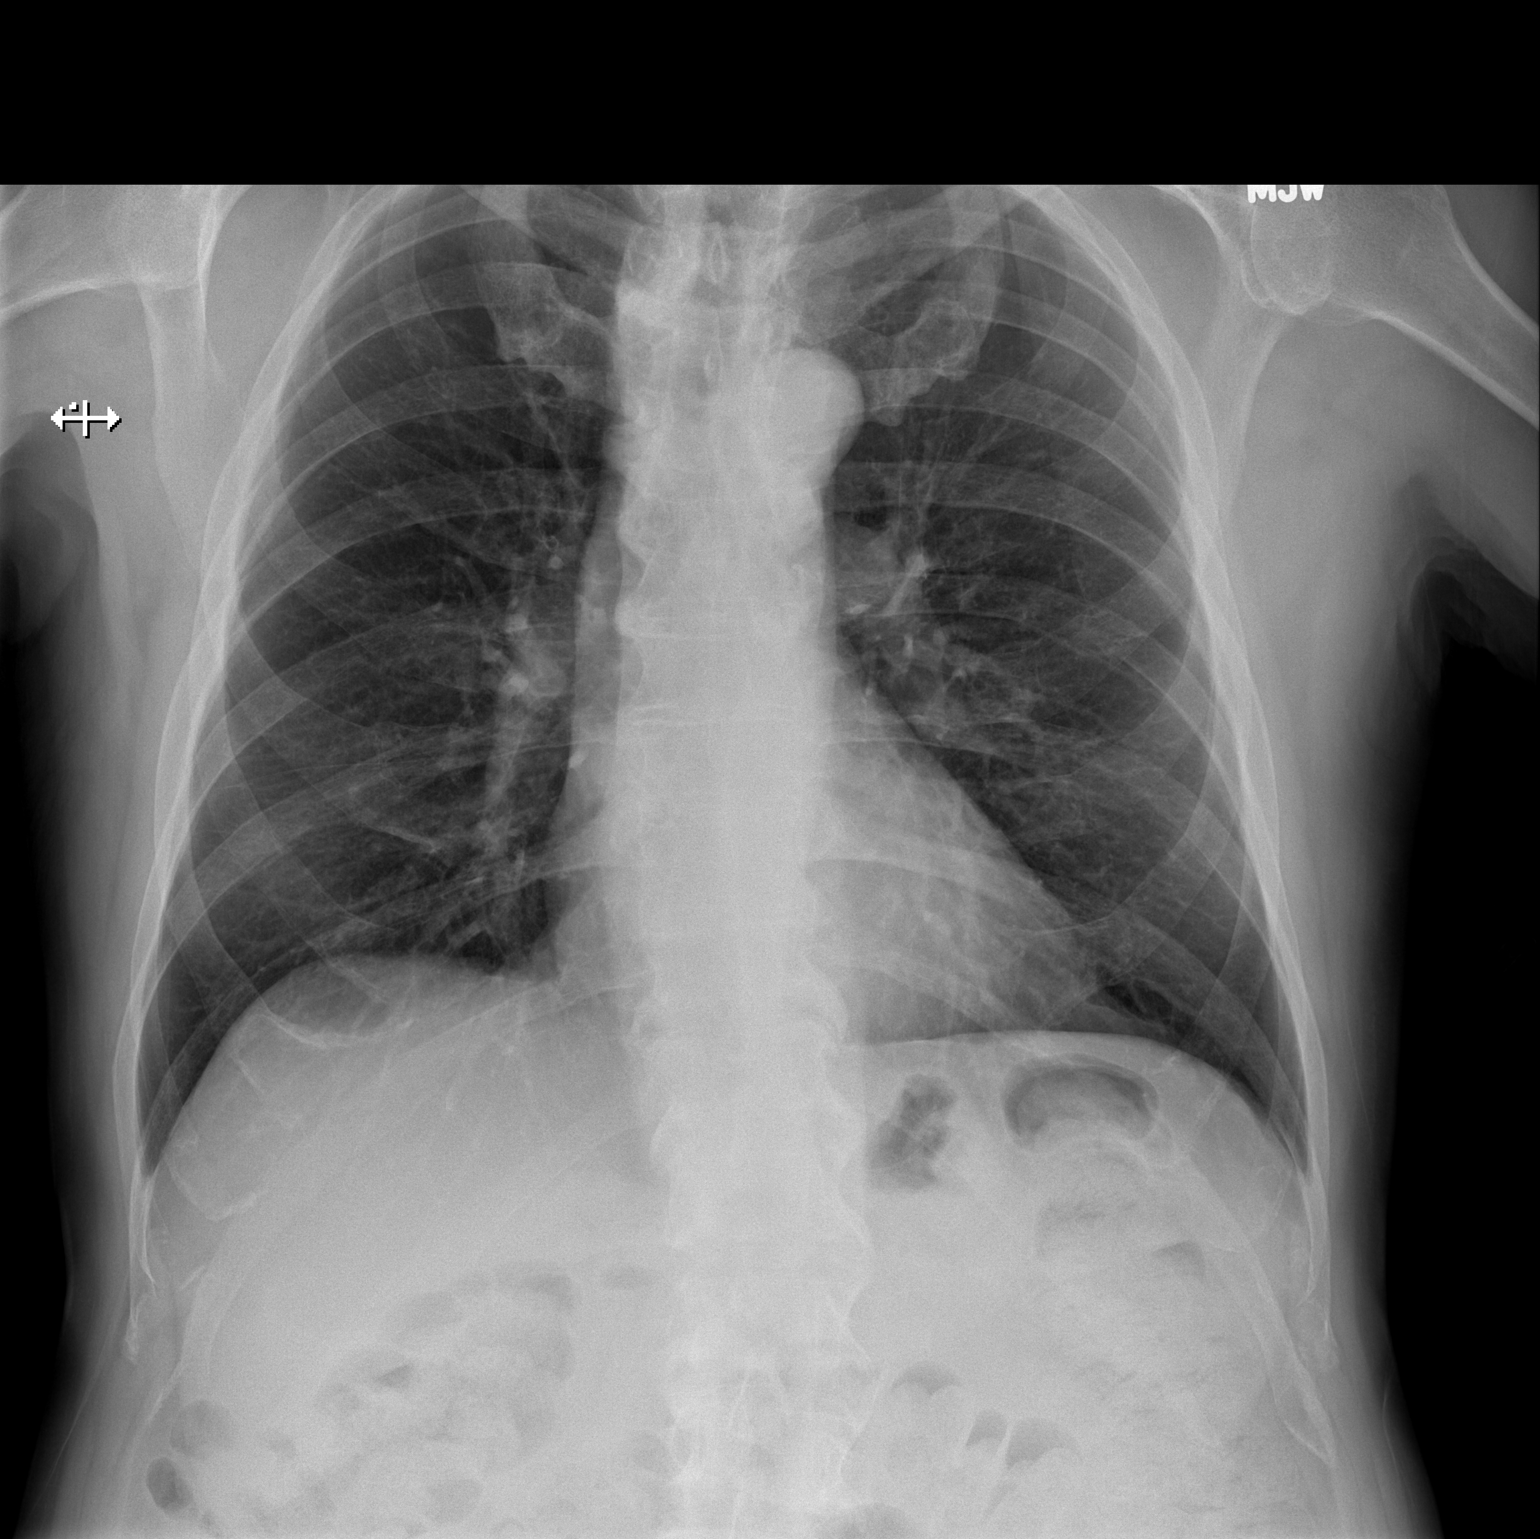

[w chest lat]
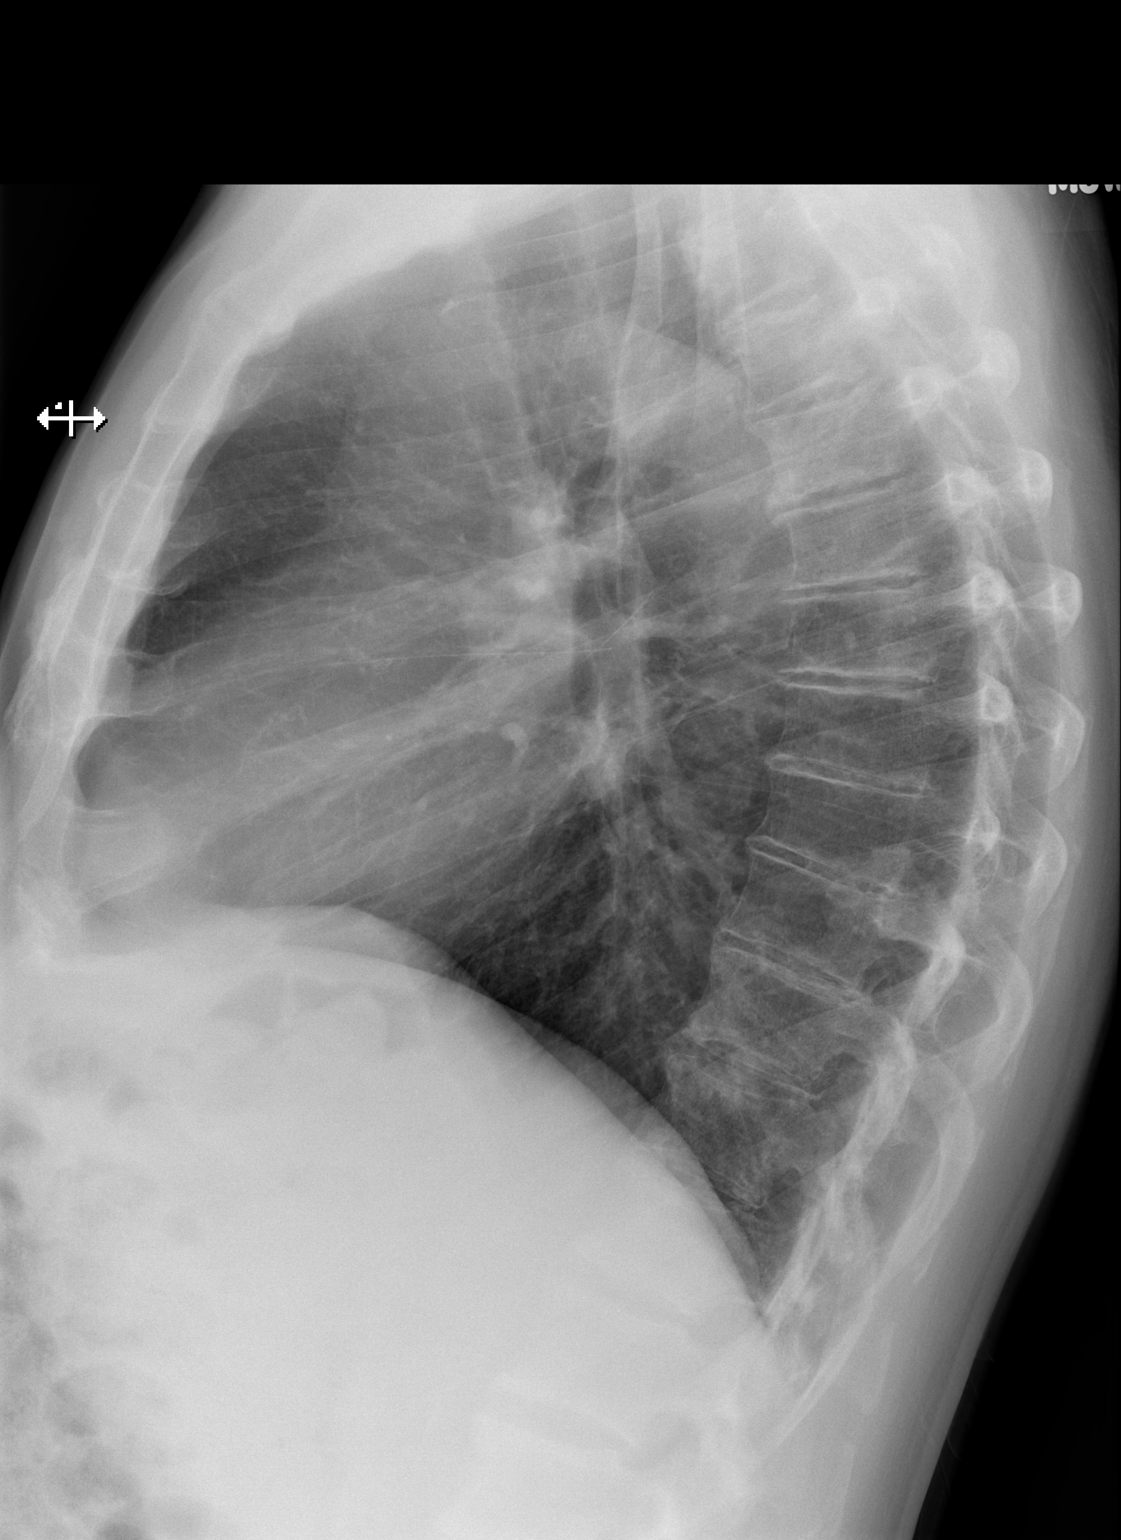

[2 of 2 positions shown; findings below may reference images not displayed]

FINDINGS: Cardiomediastinal silhouette appears normal. No pneumothorax or
pleural effusion is noted. Bony thorax is intact. Mild
hyperexpansion of the lungs is noted suggesting chronic obstructive
pulmonary disease. Degenerative changes of lower thoracic spine are
noted.
IMPRESSION: No acute cardiopulmonary abnormality seen. Findings suggestive of
chronic obstructive pulmonary disease.

## 2014-03-06 DIAGNOSIS — L570 Actinic keratosis: Secondary | ICD-10-CM | POA: Diagnosis not present

## 2014-08-23 ENCOUNTER — Ambulatory Visit: Payer: Medicare Other | Admitting: Vascular Surgery

## 2014-08-23 ENCOUNTER — Other Ambulatory Visit (HOSPITAL_COMMUNITY): Payer: Medicare Other

## 2014-09-03 DIAGNOSIS — Z961 Presence of intraocular lens: Secondary | ICD-10-CM | POA: Diagnosis not present

## 2014-09-04 DIAGNOSIS — L570 Actinic keratosis: Secondary | ICD-10-CM | POA: Diagnosis not present

## 2014-09-18 ENCOUNTER — Encounter: Payer: Self-pay | Admitting: Vascular Surgery

## 2014-09-20 ENCOUNTER — Ambulatory Visit (INDEPENDENT_AMBULATORY_CARE_PROVIDER_SITE_OTHER): Payer: Medicare Other | Admitting: Vascular Surgery

## 2014-09-20 ENCOUNTER — Ambulatory Visit (HOSPITAL_COMMUNITY)
Admission: RE | Admit: 2014-09-20 | Discharge: 2014-09-20 | Disposition: A | Discharge status: Home / Self Care | Payer: Medicare Other | Source: Ambulatory Visit | Attending: Vascular Surgery | Admitting: Vascular Surgery

## 2014-09-20 ENCOUNTER — Encounter: Payer: Self-pay | Admitting: Vascular Surgery

## 2014-09-20 VITALS — BP 146/79 | HR 61 | Ht 68.5 in | Wt 165.3 lb

## 2014-09-20 DIAGNOSIS — I6523 Occlusion and stenosis of bilateral carotid arteries: Secondary | ICD-10-CM | POA: Diagnosis not present

## 2014-09-20 DIAGNOSIS — I6522 Occlusion and stenosis of left carotid artery: Secondary | ICD-10-CM | POA: Diagnosis not present

## 2014-09-20 DIAGNOSIS — Z9889 Other specified postprocedural states: Secondary | ICD-10-CM

## 2014-09-20 DIAGNOSIS — Z951 Presence of aortocoronary bypass graft: Secondary | ICD-10-CM

## 2014-09-20 NOTE — Progress Notes (Signed)
    Established Carotid Patient  History of Present Illness  Terrance Brown is a 74 y.o. (05-09-1940) male who presents for routine follow-up s/p right carotid endarterectomy 01/30/13 by Dr. Oneida Alar.   Previous carotid studies demonstrated: no recurrent stenosis of right ICA , left ICA <40% stenosis.  Patient denies anyTIA or stroke symptoms.  The patient has not had amaurosis fugax or monocular blindness.  The patient denies facial drooping or hemiplegia.  The patient has not had receptive or expressive aphasia.    The patient's PMH, PSH, SH, FamHx, Med, and Allergies are unchanged from 08/17/13. Marland Kitchen  He remains active and plays golf a couple times a week. He is on a statin for hyperlipidemia and take a daily aspirin. His hypertension is controlled on lisinopril/hctz. He is not a smoker.   Physical Examination  Filed Vitals:   09/20/14 1305 09/20/14 1307  BP: 135/76 146/79  Pulse: 61 61  Height: 5' 8.5" (1.74 m)   Weight: 165 lb 4.8 oz (74.98 kg)   SpO2: 97%    Body mass index is 24.77 kg/(m^2).  General: A&O x 3, WDWN male   Neck: Supple, well healed right carotid endarterectomy incision no carotid bruit  Pulmonary: Normal respiratory effort, lungs clear to auscultation bilaterally   Cardiac: RRR, Nl S1, S2, no Murmurs, rubs or gallops, no carotid bruits  Vascular: 2+ radial pulses bilaterally   Musculoskeletal: M/S 5/5 throughout.   Neurologic: No focal deficits   Non-Invasive Vascular Imaging  CAROTID DUPLEX (Date: 09/20/14):   R ICA stenosis: patent right carotid endarterectomy  R VA:  patent and antegrade  L ICA stenosis: <40%  L VA:  patent and antegrade  Medical Decision Making  Terrance Brown is a 74 y.o. male who presents s/p right carotid endartectomy 01/30/2013.    Right carotid endarterectomy is patent with no evidence of restenosis. Left side without changes from previous exam (<40% stenosis)  He is on an aspirin and statin.  Follow up in one year  with repeat carotid duplex.   Virgina Jock, PA-C [facility] [place] Office: [phone] Pager: [phone]  09/20/2014, 1:27 PM  This patient was seen in conjunction with Dr. Oneida Alar.    History and exam details as above. No recurrent carotid stenosis asymptomatic on aspirin will follow-up in one year.  Ruta Hinds, MD [facility] [place] Office: [phone] Pager: [phone]

## 2014-09-20 NOTE — Addendum Note (Signed)
Addended by: Dorthula Rue L on: 09/20/2014 01:56 PM   Modules accepted: Orders

## 2014-09-25 ENCOUNTER — Encounter: Payer: Self-pay | Admitting: Cardiology

## 2014-12-25 DIAGNOSIS — Z23 Encounter for immunization: Secondary | ICD-10-CM | POA: Diagnosis not present

## 2015-03-13 DIAGNOSIS — C44722 Squamous cell carcinoma of skin of right lower limb, including hip: Secondary | ICD-10-CM | POA: Diagnosis not present

## 2015-03-13 DIAGNOSIS — Z85828 Personal history of other malignant neoplasm of skin: Secondary | ICD-10-CM | POA: Diagnosis not present

## 2015-03-13 DIAGNOSIS — L570 Actinic keratosis: Secondary | ICD-10-CM | POA: Diagnosis not present

## 2015-03-13 DIAGNOSIS — D485 Neoplasm of uncertain behavior of skin: Secondary | ICD-10-CM | POA: Diagnosis not present

## 2015-03-28 DIAGNOSIS — C44722 Squamous cell carcinoma of skin of right lower limb, including hip: Secondary | ICD-10-CM | POA: Diagnosis not present

## 2015-05-17 DIAGNOSIS — R5383 Other fatigue: Secondary | ICD-10-CM | POA: Diagnosis not present

## 2015-05-17 DIAGNOSIS — E1165 Type 2 diabetes mellitus with hyperglycemia: Secondary | ICD-10-CM | POA: Diagnosis not present

## 2015-05-17 DIAGNOSIS — E782 Mixed hyperlipidemia: Secondary | ICD-10-CM | POA: Diagnosis not present

## 2015-05-17 DIAGNOSIS — I10 Essential (primary) hypertension: Secondary | ICD-10-CM | POA: Diagnosis not present

## 2015-05-24 DIAGNOSIS — E782 Mixed hyperlipidemia: Secondary | ICD-10-CM | POA: Diagnosis not present

## 2015-05-24 DIAGNOSIS — K219 Gastro-esophageal reflux disease without esophagitis: Secondary | ICD-10-CM | POA: Diagnosis not present

## 2015-05-24 DIAGNOSIS — M810 Age-related osteoporosis without current pathological fracture: Secondary | ICD-10-CM | POA: Diagnosis not present

## 2015-05-24 DIAGNOSIS — I10 Essential (primary) hypertension: Secondary | ICD-10-CM | POA: Diagnosis not present

## 2015-05-24 DIAGNOSIS — E1165 Type 2 diabetes mellitus with hyperglycemia: Secondary | ICD-10-CM | POA: Diagnosis not present

## 2015-05-30 DIAGNOSIS — Z8262 Family history of osteoporosis: Secondary | ICD-10-CM | POA: Diagnosis not present

## 2015-05-30 DIAGNOSIS — E7800 Pure hypercholesterolemia, unspecified: Secondary | ICD-10-CM | POA: Diagnosis not present

## 2015-05-30 DIAGNOSIS — Z79899 Other long term (current) drug therapy: Secondary | ICD-10-CM | POA: Diagnosis not present

## 2015-05-30 DIAGNOSIS — Z7982 Long term (current) use of aspirin: Secondary | ICD-10-CM | POA: Diagnosis not present

## 2015-05-30 DIAGNOSIS — Z13820 Encounter for screening for osteoporosis: Secondary | ICD-10-CM | POA: Diagnosis not present

## 2015-05-30 DIAGNOSIS — R7303 Prediabetes: Secondary | ICD-10-CM | POA: Diagnosis not present

## 2015-05-30 DIAGNOSIS — Z87891 Personal history of nicotine dependence: Secondary | ICD-10-CM | POA: Diagnosis not present

## 2015-05-30 DIAGNOSIS — I10 Essential (primary) hypertension: Secondary | ICD-10-CM | POA: Diagnosis not present

## 2015-05-30 DIAGNOSIS — M549 Dorsalgia, unspecified: Secondary | ICD-10-CM | POA: Diagnosis not present

## 2015-05-30 DIAGNOSIS — M810 Age-related osteoporosis without current pathological fracture: Secondary | ICD-10-CM | POA: Diagnosis not present

## 2015-09-16 DIAGNOSIS — L570 Actinic keratosis: Secondary | ICD-10-CM | POA: Diagnosis not present

## 2015-09-20 ENCOUNTER — Encounter: Payer: Self-pay | Admitting: Family

## 2015-09-26 ENCOUNTER — Ambulatory Visit (HOSPITAL_COMMUNITY)
Admission: RE | Admit: 2015-09-26 | Discharge: 2015-09-26 | Disposition: A | Discharge status: Home / Self Care | Payer: Medicare Other | Source: Ambulatory Visit | Attending: Family | Admitting: Family

## 2015-09-26 ENCOUNTER — Encounter: Payer: Self-pay | Admitting: Family

## 2015-09-26 ENCOUNTER — Ambulatory Visit (INDEPENDENT_AMBULATORY_CARE_PROVIDER_SITE_OTHER): Payer: Medicare Other | Admitting: Family

## 2015-09-26 VITALS — BP 117/73 | HR 65 | Temp 97.7°F | Resp 16 | Ht 68.5 in | Wt 167.0 lb

## 2015-09-26 DIAGNOSIS — E119 Type 2 diabetes mellitus without complications: Secondary | ICD-10-CM | POA: Diagnosis not present

## 2015-09-26 DIAGNOSIS — I6522 Occlusion and stenosis of left carotid artery: Secondary | ICD-10-CM | POA: Diagnosis not present

## 2015-09-26 DIAGNOSIS — I6523 Occlusion and stenosis of bilateral carotid arteries: Secondary | ICD-10-CM | POA: Diagnosis not present

## 2015-09-26 DIAGNOSIS — Z951 Presence of aortocoronary bypass graft: Secondary | ICD-10-CM | POA: Insufficient documentation

## 2015-09-26 DIAGNOSIS — I10 Essential (primary) hypertension: Secondary | ICD-10-CM | POA: Insufficient documentation

## 2015-09-26 DIAGNOSIS — E785 Hyperlipidemia, unspecified: Secondary | ICD-10-CM | POA: Diagnosis not present

## 2015-09-26 DIAGNOSIS — G4700 Insomnia, unspecified: Secondary | ICD-10-CM | POA: Diagnosis not present

## 2015-09-26 DIAGNOSIS — Z9889 Other specified postprocedural states: Secondary | ICD-10-CM | POA: Diagnosis not present

## 2015-09-26 LAB — VAS US CAROTID
LCCADDIAS: 29 cm/s
LCCAPDIAS: 30 cm/s
LCCAPSYS: 126 cm/s
LEFT ECA DIAS: 14 cm/s
Left CCA dist sys: 110 cm/s
Left ICA dist dias: -27 cm/s
Left ICA dist sys: -83 cm/s
RIGHT ECA DIAS: -13 cm/s
RIGHT VERTEBRAL DIAS: 15 cm/s
Right CCA prox dias: 21 cm/s
Right CCA prox sys: 107 cm/s
Right cca dist sys: -106 cm/s

## 2015-09-26 NOTE — Progress Notes (Signed)
Chief Complaint: Follow up Extracranial Carotid Artery Stenosis   History of Present Illness  Terrance Brown is a 75 y.o. male who presents for routine follow-up s/p right carotid endarterectomy 01/30/13 by Dr. Oneida Alar.   Previous carotid studies demonstrated: no recurrent stenosis of right ICA , left ICA <40% stenosis. Patient denies any history of TIA or stroke symptoms.Speciifcally he denies a history of amaurosis fugax or monocular blindness, unilateral facial drooping, hemiplegia, or receptive or expressive aphasia.   He remains active and plays golf a couple times a week. He is on a statin for hyperlipidemia and take a daily aspirin. His hypertension is controlled on lisinopril/hctz. He is not a smoker.    The patient denies New Medical or Surgical History.  Pt Diabetic: no Pt smoker: former smoker, quit in 1994, started smoking at age 23 or 48  Pt meds include: Statin : yes ASA: no Other anticoagulants/antiplatelets: no   Past Medical History  Diagnosis Date  . Carotid artery occlusion   . Diabetes mellitus without complication ([place])   . Hypertension   . Erectile dysfunction   . Hyperlipidemia   . Insomnia   . Cancer ([place])     skin CA removed from neck,ear, back, hand  . Arthritis   . HOH (hard of hearing)     Social History Social History  Substance Use Topics  . Smoking status: Former Smoker    Quit date: 01/19/1993  . Smokeless tobacco: Never Used  . Alcohol Use: 9.6 - 12.0 oz/week    2-3 Glasses of wine, 2-3 Cans of beer, 12-14 Shots of liquor per week     Comment: weekly    Family History Family History  Problem Relation Age of Onset  . Diabetes Father   . Heart disease Father   . Hyperlipidemia Father   . Heart attack Father   . Hypertension Father     Surgical History Past Surgical History  Procedure Laterality Date  . Hemorrhoid surgery    . Tonsillectomy      as a child  . Colonoscopy    . Endarterectomy Right 01/30/2013   Procedure: ENDARTERECTOMY CAROTID-RIGHT;  Surgeon: Elam Dutch, MD;  Location: [facility] OR;  Service: Vascular;  Laterality: Right;  . Patch angioplasty Right 01/30/2013    Procedure: RIGHT CAROTID ARTERY PATCH ANGIOPLASTY USING HEMASHIELD PATCH;  Surgeon: Elam Dutch, MD;  Location: [facility] [facility];  Service: Vascular;  Laterality: Right;    Allergies  Allergen Reactions  . Bupropion Palpitations    Current Outpatient Prescriptions  Medication Sig Dispense Refill  . alendronate (FOSAMAX) 70 MG tablet Take 70 mg by mouth once a week. Take with a full glass of water on an empty stomach on Fridays    . aspirin EC 81 MG tablet Take 81 mg by mouth daily.    Marland Kitchen atorvastatin (LIPITOR) 20 MG tablet Take 20 mg by mouth daily.    . cholecalciferol (VITAMIN D) 1000 UNITS tablet Take 1,000 Units by mouth daily. Reported on 09/26/2015    . lisinopril-hydrochlorothiazide (PRINZIDE,ZESTORETIC) 10-12.5 MG per tablet Take 1 tablet by mouth daily.    Marland Kitchen lovastatin (MEVACOR) 20 MG tablet Take 40 mg by mouth daily after supper.     . Multiple Vitamin (MULTIVITAMIN WITH MINERALS) TABS tablet Take 1 tablet by mouth daily.    . naproxen sodium (ALEVE) 220 MG tablet Take 220 mg by mouth daily.    Marland Kitchen oxyCODONE-acetaminophen (ROXICET) 5-325 MG per tablet Take 1-2 tablets by mouth every  4 (four) hours as needed for severe pain. (Patient not taking: Reported on 09/26/2015) 30 tablet 0  . sildenafil (VIAGRA) 100 MG tablet Take 100 mg by mouth daily as needed for erectile dysfunction.    Marland Kitchen zolpidem (AMBIEN) 10 MG tablet Take 10 mg by mouth at bedtime.      No current facility-administered medications for this visit.    Review of Systems : See HPI for pertinent positives and negatives.  Physical Examination  Filed Vitals:   09/26/15 1426 09/26/15 1431  BP: 120/74 117/73  Pulse: 65   Temp: 97.7 F (36.5 C)   TempSrc: Oral   Resp: 16   Height: 5' 8.5" (1.74 m)   Weight: 167 lb (75.751 kg)   SpO2: 96%    Body mass  index is 25.02 kg/(m^2).  General: WDWN male in NAD GAIT: normal Eyes: PERRLA Pulmonary:  Respirations are non-labored, CTAB.  Cardiac: regular rhythm, no detected murmur.  VASCULAR EXAM Carotid Bruits Right Left   Negative Negative    Aorta is not palpable. Radial pulses are 2+ palpable and equal.                                                                                                                            LE Pulses Right Left       POPLITEAL  not palpable   not palpable       POSTERIOR TIBIAL   palpable    palpable        DORSALIS PEDIS      ANTERIOR TIBIAL  palpable  palpable     Gastrointestinal: soft, nontender, BS WNL, no r/g, no palpable masses.  Musculoskeletal: no muscle atrophy/wasting. M/S 5/5 throughout, extremities without ischemic changes.  Neurologic: A&O X 3; Appropriate Affect, Speech is norma, CN 2-12 intact, pain and light touch intact in extremities, Motor exam as listed above.   Non-Invasive Vascular Imaging CAROTID DUPLEX 09/26/2015   CEREBROVASCULAR DUPLEX EVALUATION    INDICATION: Carotid artery disease    PREVIOUS INTERVENTION(S): Right carotid endarterectomy 01/30/2013     DUPLEX EXAM: Carotid duplex    RIGHT  LEFT  Peak Systolic Velocities (cm/s) End Diastolic Velocities (cm/s) Plaque LOCATION Peak Systolic Velocities (cm/s) End Diastolic Velocities (cm/s) Plaque  107 21  CCA PROXIMAL 126 30   128 26 HT CCA MID 134 31   130 24 HT CCA DISTAL 110 29 HT  175 13 HM ECA 173 14 HT  94 23  ICA PROXIMAL 136 42 HT  106 32  ICA MID 93 24   106 31  ICA DISTAL 83 27     NA ICA / CCA Ratio (PSV) 1.0  Antegrade Vertebral Flow Antegrade  - Brachial Systolic Pressure (mmHg) -  Triphasic Brachial Artery Waveforms Triphasic    Plaque Morphology:  HM = Homogeneous, HT = Heterogeneous, CP = Calcific Plaque, SP = Smooth Plaque, IP = Irregular Plaque     ADDITIONAL FINDINGS: Multiphasic subclavian arteries  IMPRESSION: 1. Patent  right carotid endarterectomy without evidence for restenosis 2.  40%  - 59% left  internal carotid artery stenosis    Compared to the previous exam:  Slight increase of velocity on the left    Assessment: Terrance Brown is a 75 y.o. male s/p right carotid endarterectomy 01/30/13. He has no history of stroke or TIA.  Today's carotid duplex suggests a patent right carotid endarterectomy without evidence for restenosis.  40%  - 59% left  internal carotid artery stenosis which is a slight increase compared to the exam of 09/20/14.  Fortunately he does not have DM and quit smoking 23 years ago. He has a normal BMI and stays physically active.  Plan: Follow-up in 1 year with Carotid Duplex scan.   I discussed in depth with the patient the nature of atherosclerosis, and emphasized the importance of maximal medical management including strict control of blood pressure, blood glucose, and lipid levels, obtaining regular exercise, and continued cessation of smoking.  The patient is aware that without maximal medical management the underlying atherosclerotic disease process will progress, limiting the benefit of any interventions. The patient was given information about stroke prevention and what symptoms should prompt the patient to seek immediate medical care. Thank you for allowing [place] to participate in this patient's care.  Clemon Chambers, RN, MSN, FNP-C [facility] Office: [phone]  Clinic Physician: Bridgett Larsson  09/26/2015 2:53 PM

## 2015-09-26 NOTE — Patient Instructions (Signed)
Stroke Prevention Some medical conditions and behaviors are associated with an increased chance of having a stroke. You may prevent a stroke by making healthy choices and managing medical conditions. HOW CAN I REDUCE MY RISK OF HAVING A STROKE?   Stay physically active. Get at least 30 minutes of activity on most or all days.  Do not smoke. It may also be helpful to avoid exposure to secondhand smoke.  Limit alcohol use. Moderate alcohol use is considered to be:  No more than 2 drinks per day for men.  No more than 1 drink per day for nonpregnant women.  Eat healthy foods. This involves:  Eating 5 or more servings of fruits and vegetables a day.  Making dietary changes that address high blood pressure (hypertension), high cholesterol, diabetes, or obesity.  Manage your cholesterol levels.  Making food choices that are high in fiber and low in saturated fat, trans fat, and cholesterol may control cholesterol levels.  Take any prescribed medicines to control cholesterol as directed by your health care provider.  Manage your diabetes.  Controlling your carbohydrate and sugar intake is recommended to manage diabetes.  Take any prescribed medicines to control diabetes as directed by your health care provider.  Control your hypertension.  Making food choices that are low in salt (sodium), saturated fat, trans fat, and cholesterol is recommended to manage hypertension.  Ask your health care provider if you need treatment to lower your blood pressure. Take any prescribed medicines to control hypertension as directed by your health care provider.  If you are 18-39 years of age, have your blood pressure checked every 3-5 years. If you are 40 years of age or older, have your blood pressure checked every year.  Maintain a healthy weight.  Reducing calorie intake and making food choices that are low in sodium, saturated fat, trans fat, and cholesterol are recommended to manage  weight.  Stop drug abuse.  Avoid taking birth control pills.  Talk to your health care provider about the risks of taking birth control pills if you are over 35 years old, smoke, get migraines, or have ever had a blood clot.  Get evaluated for sleep disorders (sleep apnea).  Talk to your health care provider about getting a sleep evaluation if you snore a lot or have excessive sleepiness.  Take medicines only as directed by your health care provider.  For some people, aspirin or blood thinners (anticoagulants) are helpful in reducing the risk of forming abnormal blood clots that can lead to stroke. If you have the irregular heart rhythm of atrial fibrillation, you should be on a blood thinner unless there is a good reason you cannot take them.  Understand all your medicine instructions.  Make sure that other conditions (such as anemia or atherosclerosis) are addressed. SEEK IMMEDIATE MEDICAL CARE IF:   You have sudden weakness or numbness of the face, arm, or leg, especially on one side of the body.  Your face or eyelid droops to one side.  You have sudden confusion.  You have trouble speaking (aphasia) or understanding.  You have sudden trouble seeing in one or both eyes.  You have sudden trouble walking.  You have dizziness.  You have a loss of balance or coordination.  You have a sudden, severe headache with no known cause.  You have new chest pain or an irregular heartbeat. Any of these symptoms may represent a serious problem that is an emergency. Do not wait to see if the symptoms will   go away. Get medical help at once. Call your local emergency services (911 in [place] Do not drive yourself to the hospital.   This information is not intended to replace advice given to you by your health care provider. Make sure you discuss any questions you have with your health care provider.   Document Released: 04/02/2004 Document Revised: 03/16/2014 Document Reviewed:  08/26/2012 Elsevier Interactive Patient Education 2016 Else[employer] Inc.  

## 2015-10-17 DIAGNOSIS — Z6824 Body mass index (BMI) 24.0-24.9, adult: Secondary | ICD-10-CM | POA: Diagnosis not present

## 2015-10-17 DIAGNOSIS — M79644 Pain in right finger(s): Secondary | ICD-10-CM | POA: Diagnosis not present

## 2015-11-04 ENCOUNTER — Other Ambulatory Visit: Payer: Self-pay

## 2015-11-13 DIAGNOSIS — Z23 Encounter for immunization: Secondary | ICD-10-CM | POA: Diagnosis not present

## 2015-11-19 DIAGNOSIS — J069 Acute upper respiratory infection, unspecified: Secondary | ICD-10-CM | POA: Diagnosis not present

## 2015-11-19 DIAGNOSIS — Z6825 Body mass index (BMI) 25.0-25.9, adult: Secondary | ICD-10-CM | POA: Diagnosis not present

## 2015-12-16 DIAGNOSIS — K219 Gastro-esophageal reflux disease without esophagitis: Secondary | ICD-10-CM | POA: Diagnosis not present

## 2015-12-16 DIAGNOSIS — Z6823 Body mass index (BMI) 23.0-23.9, adult: Secondary | ICD-10-CM | POA: Diagnosis not present

## 2015-12-16 DIAGNOSIS — Z9189 Other specified personal risk factors, not elsewhere classified: Secondary | ICD-10-CM | POA: Diagnosis not present

## 2015-12-16 DIAGNOSIS — Z1389 Encounter for screening for other disorder: Secondary | ICD-10-CM | POA: Diagnosis not present

## 2015-12-16 DIAGNOSIS — I10 Essential (primary) hypertension: Secondary | ICD-10-CM | POA: Diagnosis not present

## 2015-12-16 DIAGNOSIS — Z23 Encounter for immunization: Secondary | ICD-10-CM | POA: Diagnosis not present

## 2015-12-16 DIAGNOSIS — M810 Age-related osteoporosis without current pathological fracture: Secondary | ICD-10-CM | POA: Diagnosis not present

## 2015-12-16 DIAGNOSIS — E1165 Type 2 diabetes mellitus with hyperglycemia: Secondary | ICD-10-CM | POA: Diagnosis not present

## 2015-12-16 DIAGNOSIS — E782 Mixed hyperlipidemia: Secondary | ICD-10-CM | POA: Diagnosis not present

## 2016-01-24 NOTE — Addendum Note (Signed)
Addended by: Lianne Cure A on: 01/24/2016 02:14 PM   Modules accepted: Orders

## 2016-02-25 DIAGNOSIS — E782 Mixed hyperlipidemia: Secondary | ICD-10-CM | POA: Diagnosis not present

## 2016-02-25 DIAGNOSIS — E1165 Type 2 diabetes mellitus with hyperglycemia: Secondary | ICD-10-CM | POA: Diagnosis not present

## 2016-02-25 DIAGNOSIS — I10 Essential (primary) hypertension: Secondary | ICD-10-CM | POA: Diagnosis not present

## 2016-02-25 DIAGNOSIS — Z1212 Encounter for screening for malignant neoplasm of rectum: Secondary | ICD-10-CM | POA: Diagnosis not present

## 2016-02-25 DIAGNOSIS — K219 Gastro-esophageal reflux disease without esophagitis: Secondary | ICD-10-CM | POA: Diagnosis not present

## 2016-02-25 DIAGNOSIS — Z0001 Encounter for general adult medical examination with abnormal findings: Secondary | ICD-10-CM | POA: Diagnosis not present

## 2016-03-24 DIAGNOSIS — L570 Actinic keratosis: Secondary | ICD-10-CM | POA: Diagnosis not present

## 2016-04-21 DIAGNOSIS — Z1211 Encounter for screening for malignant neoplasm of colon: Secondary | ICD-10-CM | POA: Diagnosis not present

## 2016-04-21 DIAGNOSIS — Z1212 Encounter for screening for malignant neoplasm of rectum: Secondary | ICD-10-CM | POA: Diagnosis not present

## 2016-05-27 DIAGNOSIS — M47816 Spondylosis without myelopathy or radiculopathy, lumbar region: Secondary | ICD-10-CM | POA: Diagnosis not present

## 2016-05-27 DIAGNOSIS — M9903 Segmental and somatic dysfunction of lumbar region: Secondary | ICD-10-CM | POA: Diagnosis not present

## 2016-05-29 DIAGNOSIS — M47816 Spondylosis without myelopathy or radiculopathy, lumbar region: Secondary | ICD-10-CM | POA: Diagnosis not present

## 2016-05-29 DIAGNOSIS — M9903 Segmental and somatic dysfunction of lumbar region: Secondary | ICD-10-CM | POA: Diagnosis not present

## 2016-06-01 DIAGNOSIS — M9903 Segmental and somatic dysfunction of lumbar region: Secondary | ICD-10-CM | POA: Diagnosis not present

## 2016-06-01 DIAGNOSIS — M47816 Spondylosis without myelopathy or radiculopathy, lumbar region: Secondary | ICD-10-CM | POA: Diagnosis not present

## 2016-06-03 DIAGNOSIS — M47816 Spondylosis without myelopathy or radiculopathy, lumbar region: Secondary | ICD-10-CM | POA: Diagnosis not present

## 2016-06-03 DIAGNOSIS — M9903 Segmental and somatic dysfunction of lumbar region: Secondary | ICD-10-CM | POA: Diagnosis not present

## 2016-06-24 DIAGNOSIS — E1165 Type 2 diabetes mellitus with hyperglycemia: Secondary | ICD-10-CM | POA: Diagnosis not present

## 2016-06-24 DIAGNOSIS — K219 Gastro-esophageal reflux disease without esophagitis: Secondary | ICD-10-CM | POA: Diagnosis not present

## 2016-06-24 DIAGNOSIS — I10 Essential (primary) hypertension: Secondary | ICD-10-CM | POA: Diagnosis not present

## 2016-06-24 DIAGNOSIS — E782 Mixed hyperlipidemia: Secondary | ICD-10-CM | POA: Diagnosis not present

## 2016-06-24 DIAGNOSIS — R5383 Other fatigue: Secondary | ICD-10-CM | POA: Diagnosis not present

## 2016-06-29 DIAGNOSIS — Z6824 Body mass index (BMI) 24.0-24.9, adult: Secondary | ICD-10-CM | POA: Diagnosis not present

## 2016-06-29 DIAGNOSIS — Z1212 Encounter for screening for malignant neoplasm of rectum: Secondary | ICD-10-CM | POA: Diagnosis not present

## 2016-06-29 DIAGNOSIS — M810 Age-related osteoporosis without current pathological fracture: Secondary | ICD-10-CM | POA: Diagnosis not present

## 2016-06-29 DIAGNOSIS — I10 Essential (primary) hypertension: Secondary | ICD-10-CM | POA: Diagnosis not present

## 2016-06-29 DIAGNOSIS — Z23 Encounter for immunization: Secondary | ICD-10-CM | POA: Diagnosis not present

## 2016-06-29 DIAGNOSIS — K219 Gastro-esophageal reflux disease without esophagitis: Secondary | ICD-10-CM | POA: Diagnosis not present

## 2016-06-29 DIAGNOSIS — E1165 Type 2 diabetes mellitus with hyperglycemia: Secondary | ICD-10-CM | POA: Diagnosis not present

## 2016-06-29 DIAGNOSIS — E782 Mixed hyperlipidemia: Secondary | ICD-10-CM | POA: Diagnosis not present

## 2016-07-14 DIAGNOSIS — G5603 Carpal tunnel syndrome, bilateral upper limbs: Secondary | ICD-10-CM | POA: Diagnosis not present

## 2016-07-14 DIAGNOSIS — M5412 Radiculopathy, cervical region: Secondary | ICD-10-CM | POA: Diagnosis not present

## 2016-07-21 DIAGNOSIS — R209 Unspecified disturbances of skin sensation: Secondary | ICD-10-CM | POA: Diagnosis not present

## 2016-07-21 DIAGNOSIS — M79602 Pain in left arm: Secondary | ICD-10-CM | POA: Diagnosis not present

## 2016-07-21 DIAGNOSIS — E785 Hyperlipidemia, unspecified: Secondary | ICD-10-CM | POA: Diagnosis not present

## 2016-07-21 DIAGNOSIS — G5602 Carpal tunnel syndrome, left upper limb: Secondary | ICD-10-CM | POA: Diagnosis not present

## 2016-07-21 DIAGNOSIS — G5601 Carpal tunnel syndrome, right upper limb: Secondary | ICD-10-CM | POA: Diagnosis not present

## 2016-07-21 DIAGNOSIS — I10 Essential (primary) hypertension: Secondary | ICD-10-CM | POA: Diagnosis not present

## 2016-08-13 DIAGNOSIS — E785 Hyperlipidemia, unspecified: Secondary | ICD-10-CM | POA: Diagnosis not present

## 2016-08-13 DIAGNOSIS — M79602 Pain in left arm: Secondary | ICD-10-CM | POA: Diagnosis not present

## 2016-08-13 DIAGNOSIS — I10 Essential (primary) hypertension: Secondary | ICD-10-CM | POA: Diagnosis not present

## 2016-08-13 DIAGNOSIS — G5601 Carpal tunnel syndrome, right upper limb: Secondary | ICD-10-CM | POA: Diagnosis not present

## 2016-08-13 DIAGNOSIS — R209 Unspecified disturbances of skin sensation: Secondary | ICD-10-CM | POA: Diagnosis not present

## 2016-08-13 DIAGNOSIS — G5602 Carpal tunnel syndrome, left upper limb: Secondary | ICD-10-CM | POA: Diagnosis not present

## 2016-08-24 DIAGNOSIS — G5603 Carpal tunnel syndrome, bilateral upper limbs: Secondary | ICD-10-CM | POA: Diagnosis not present

## 2016-08-28 DIAGNOSIS — Z7982 Long term (current) use of aspirin: Secondary | ICD-10-CM | POA: Diagnosis not present

## 2016-08-28 DIAGNOSIS — Z79899 Other long term (current) drug therapy: Secondary | ICD-10-CM | POA: Diagnosis not present

## 2016-08-28 DIAGNOSIS — I2510 Atherosclerotic heart disease of native coronary artery without angina pectoris: Secondary | ICD-10-CM | POA: Diagnosis not present

## 2016-08-28 DIAGNOSIS — G5602 Carpal tunnel syndrome, left upper limb: Secondary | ICD-10-CM | POA: Diagnosis not present

## 2016-08-28 DIAGNOSIS — I10 Essential (primary) hypertension: Secondary | ICD-10-CM | POA: Diagnosis not present

## 2016-09-15 DIAGNOSIS — L570 Actinic keratosis: Secondary | ICD-10-CM | POA: Diagnosis not present

## 2016-09-16 ENCOUNTER — Encounter: Payer: Self-pay | Admitting: Family

## 2016-09-30 ENCOUNTER — Encounter: Payer: Self-pay | Admitting: Family

## 2016-09-30 ENCOUNTER — Ambulatory Visit (INDEPENDENT_AMBULATORY_CARE_PROVIDER_SITE_OTHER): Payer: Medicare Other | Admitting: Family

## 2016-09-30 ENCOUNTER — Ambulatory Visit (HOSPITAL_COMMUNITY)
Admission: RE | Admit: 2016-09-30 | Discharge: 2016-09-30 | Disposition: A | Discharge status: Home / Self Care | Payer: Medicare Other | Source: Ambulatory Visit | Attending: Vascular Surgery | Admitting: Vascular Surgery

## 2016-09-30 VITALS — BP 131/80 | HR 64 | Temp 97.6°F | Resp 18 | Ht 69.0 in | Wt 165.0 lb

## 2016-09-30 DIAGNOSIS — Z9889 Other specified postprocedural states: Secondary | ICD-10-CM | POA: Diagnosis not present

## 2016-09-30 DIAGNOSIS — I6522 Occlusion and stenosis of left carotid artery: Secondary | ICD-10-CM | POA: Diagnosis not present

## 2016-09-30 DIAGNOSIS — I6523 Occlusion and stenosis of bilateral carotid arteries: Secondary | ICD-10-CM | POA: Diagnosis not present

## 2016-09-30 LAB — VAS US CAROTID
LCCADDIAS: -24 cm/s
LCCAPDIAS: 27 cm/s
LCCAPSYS: 124 cm/s
LEFT ECA DIAS: -12 cm/s
LEFT VERTEBRAL DIAS: -12 cm/s
LICADDIAS: -34 cm/s
LICADSYS: -94 cm/s
Left CCA dist sys: -94 cm/s
RCCAPSYS: 124 cm/s
RIGHT CCA MID DIAS: 23 cm/s
RIGHT ECA DIAS: -2 cm/s
RIGHT VERTEBRAL DIAS: 11 cm/s
Right CCA prox dias: 22 cm/s
Right cca dist sys: -112 cm/s

## 2016-09-30 NOTE — Progress Notes (Signed)
Chief Complaint: Follow up Extracranial Carotid Artery Stenosis   History of Present Illness  Terrance Brown is a 76 y.o. male who presents for routine follow-up s/p right carotid endarterectomy 01/30/13 by Dr. Oneida Alar.   Previous carotid studies demonstrated: no recurrent stenosis of right ICA , left ICA <40% stenosis. Patient denies any history of TIA or stroke symptoms.Speciifcally he denies a history of amaurosis fugax or monocular blindness, unilateral facial drooping, hemiplegia, or receptive or expressive aphasia.   He remains active and plays golf a couple times a week. He is on a statin for hyperlipidemia and take a daily aspirin. His hypertension is controlled on lisinopril/hctz. He is not a smoker.    He had left carpal tunnel repair in June 2018.   Pt Diabetic: no Pt smoker: former smoker, quit in 1994, started smoking at age 86 or 42  Pt meds include: Statin : yes ASA: no Other anticoagulants/antiplatelets: no   Past Medical History:  Diagnosis Date  . Arthritis   . Cancer ([place])    skin CA removed from neck,ear, back, hand  . Carotid artery occlusion   . Diabetes mellitus without complication ([place])   . Erectile dysfunction   . HOH (hard of hearing)   . Hyperlipidemia   . Hypertension   . Insomnia     Social History Social History  Substance Use Topics  . Smoking status: Former Smoker    Quit date: 01/19/1993  . Smokeless tobacco: Never Used  . Alcohol use 9.6 - 12.0 oz/week    2 - 3 Glasses of wine, 2 - 3 Cans of beer, 12 - 14 Shots of liquor per week     Comment: weekly    Family History Family History  Problem Relation Age of Onset  . Diabetes Father   . Heart disease Father   . Hyperlipidemia Father   . Heart attack Father   . Hypertension Father     Surgical History Past Surgical History:  Procedure Laterality Date  . COLONOSCOPY    . ENDARTERECTOMY Right 01/30/2013   Procedure: ENDARTERECTOMY CAROTID-RIGHT;  Surgeon: Elam Dutch, MD;  Location: [facility];  Service: Vascular;  Laterality: Right;  . HEMORRHOID SURGERY    . PATCH ANGIOPLASTY Right 01/30/2013   Procedure: RIGHT CAROTID ARTERY PATCH ANGIOPLASTY USING HEMASHIELD PATCH;  Surgeon: Elam Dutch, MD;  Location: [facility];  Service: Vascular;  Laterality: Right;  . TONSILLECTOMY     as a child    Allergies  Allergen Reactions  . Bupropion Palpitations    Current Outpatient Prescriptions  Medication Sig Dispense Refill  . alendronate (FOSAMAX) 70 MG tablet Take 70 mg by mouth once a week. Take with a full glass of water on an empty stomach on Fridays    . aspirin EC 81 MG tablet Take 81 mg by mouth daily.    Marland Kitchen atorvastatin (LIPITOR) 20 MG tablet Take 20 mg by mouth daily.    . cholecalciferol (VITAMIN D) 1000 UNITS tablet Take 1,000 Units by mouth daily. Reported on 09/26/2015    . lisinopril-hydrochlorothiazide (PRINZIDE,ZESTORETIC) 10-12.5 MG per tablet Take 1 tablet by mouth daily.    Marland Kitchen lovastatin (MEVACOR) 20 MG tablet Take 40 mg by mouth daily after supper.     . sildenafil (VIAGRA) 100 MG tablet Take 100 mg by mouth daily as needed for erectile dysfunction.    Marland Kitchen zolpidem (AMBIEN) 10 MG tablet Take 10 mg by mouth at bedtime.     . Multiple Vitamin (  MULTIVITAMIN WITH MINERALS) TABS tablet Take 1 tablet by mouth daily.    . naproxen sodium (ALEVE) 220 MG tablet Take 220 mg by mouth daily.    Marland Kitchen oxyCODONE-acetaminophen (ROXICET) 5-325 MG per tablet Take 1-2 tablets by mouth every 4 (four) hours as needed for severe pain. (Patient not taking: Reported on 09/30/2016) 30 tablet 0   No current facility-administered medications for this visit.     Review of Systems : See HPI for pertinent positives and negatives.  Physical Examination  Vitals:   09/30/16 1441 09/30/16 1442 09/30/16 1443  BP: (!) 142/74 126/75 131/80  Pulse: 64 64 64  Resp: 18    Temp: 97.6 F (36.4 C)    SpO2: 96%    Weight: 165 lb (74.8 kg)    Height: 5\' 9"  (1.753 m)     Body  mass index is 24.37 kg/m.  General: WDWN male in NAD GAIT: normal Eyes: PERRLA Pulmonary:  Respirations are non-labored, CTAB.  Cardiac: regular rhythm and rate, no detected murmur.  VASCULAR EXAM Carotid Bruits Right Left   Negative Negative    Abdominal aortic pulse is not palpable. Radial pulses are 2+ palpable and equal.                                                                                                                                          LE Pulses Right Left       POPLITEAL  not palpable   not palpable       POSTERIOR TIBIAL   palpable    palpable        DORSALIS PEDIS      ANTERIOR TIBIAL  palpable  palpable     Gastrointestinal: soft, nontender, BS WNL, no r/g, no palpable masses.  Musculoskeletal: no muscle atrophy/wasting. M/S 5/5 throughout, extremities without ischemic changes.  Neurologic: A&O X 3; appropriate affect, speech is normal, CN 2-12 intact, pain and light touch intact in extremities, Motor exam as listed above.     Assessment: Terrance Brown is a 76 y.o. male s/p right carotid endarterectomy 01/30/13. He has no history of stroke or TIA.  Fortunately he does not have DM and quit smoking in 1994. He has a normal BMI and stays physically active.  DATA Carotid Duplex (09/30/16): Patent right carotid endarterectomy without evidence for restenosis. <40% left  internal carotid artery stenosis. Bilateral vertebral artery flow is antegrade.  Bilateral subclavian artery waveforms are normal.  Unable to obtain higher velocities in the left ICA compared to the exam on 09-26-15.    Plan: Follow-up in 18 months with Carotid Duplex scan.   I discussed in depth with the patient the nature of atherosclerosis, and emphasized the importance of maximal medical management including strict control of blood pressure, blood glucose, and lipid levels, obtaining regular exercise, and continued cessation of smoking.  The patient is  aware that without  maximal medical management the underlying atherosclerotic disease process will progress, limiting the benefit of any interventions. The patient was given information about stroke prevention and what symptoms should prompt the patient to seek immediate medical care. Thank you for allowing [place] to participate in this patient's care.  Clemon Chambers, RN, MSN, FNP-C [facility] Office: [phone]  Clinic Physician: Dickson/[place]abham  09/30/16 2:47 PM

## 2016-09-30 NOTE — Patient Instructions (Signed)
Stroke Prevention Some medical conditions and behaviors are associated with an increased chance of having a stroke. You may prevent a stroke by making healthy choices and managing medical conditions. How can I reduce my risk of having a stroke?  Stay physically active. Get at least 30 minutes of activity on most or all days.  Do not smoke. It may also be helpful to avoid exposure to secondhand smoke.  Limit alcohol use. Moderate alcohol use is considered to be: ? No more than 2 drinks per day for men. ? No more than 1 drink per day for nonpregnant women.  Eat healthy foods. This involves: ? Eating 5 or more servings of fruits and vegetables a day. ? Making dietary changes that address high blood pressure (hypertension), high cholesterol, diabetes, or obesity.  Manage your cholesterol levels. ? Making food choices that are high in fiber and low in saturated fat, trans fat, and cholesterol may control cholesterol levels. ? Take any prescribed medicines to control cholesterol as directed by your health care provider.  Manage your diabetes. ? Controlling your carbohydrate and sugar intake is recommended to manage diabetes. ? Take any prescribed medicines to control diabetes as directed by your health care provider.  Control your hypertension. ? Making food choices that are low in salt (sodium), saturated fat, trans fat, and cholesterol is recommended to manage hypertension. ? Ask your health care provider if you need treatment to lower your blood pressure. Take any prescribed medicines to control hypertension as directed by your health care provider. ? If you are 18-39 years of age, have your blood pressure checked every 3-5 years. If you are 40 years of age or older, have your blood pressure checked every year.  Maintain a healthy weight. ? Reducing calorie intake and making food choices that are low in sodium, saturated fat, trans fat, and cholesterol are recommended to manage  weight.  Stop drug abuse.  Avoid taking birth control pills. ? Talk to your health care provider about the risks of taking birth control pills if you are over 35 years old, smoke, get migraines, or have ever had a blood clot.  Get evaluated for sleep disorders (sleep apnea). ? Talk to your health care provider about getting a sleep evaluation if you snore a lot or have excessive sleepiness.  Take medicines only as directed by your health care provider. ? For some people, aspirin or blood thinners (anticoagulants) are helpful in reducing the risk of forming abnormal blood clots that can lead to stroke. If you have the irregular heart rhythm of atrial fibrillation, you should be on a blood thinner unless there is a good reason you cannot take them. ? Understand all your medicine instructions.  Make sure that other conditions (such as anemia or atherosclerosis) are addressed. Get help right away if:  You have sudden weakness or numbness of the face, arm, or leg, especially on one side of the body.  Your face or eyelid droops to one side.  You have sudden confusion.  You have trouble speaking (aphasia) or understanding.  You have sudden trouble seeing in one or both eyes.  You have sudden trouble walking.  You have dizziness.  You have a loss of balance or coordination.  You have a sudden, severe headache with no known cause.  You have new chest pain or an irregular heartbeat. Any of these symptoms may represent a serious problem that is an emergency. Do not wait to see if the symptoms will go away.   Get medical help at once. Call your local emergency services (911 in [place] Do not drive yourself to the hospital. This information is not intended to replace advice given to you by your health care provider. Make sure you discuss any questions you have with your health care provider. Document Released: 04/02/2004 Document Revised: 08/01/2015 Document Reviewed: 08/26/2012 Elsevier  Interactive Patient Education  2017 Elsevier Inc.     Preventing Cerebrovascular Disease Arteries are blood vessels that carry blood that contains oxygen from the heart to all parts of the body. Cerebrovascular disease affects arteries that supply the brain. Any condition that blocks or disrupts blood flow to the brain can cause cerebrovascular disease. Brain cells that lose blood supply start to die within minutes (stroke). Stroke is the main danger of cerebrovascular disease. Atherosclerosis and high blood pressure are common causes of cerebrovascular disease. Atherosclerosis is narrowing and hardening of an artery that results when fat, cholesterol, calcium, or other substances (plaque) build up inside an artery. Plaque reduces blood flow through the artery. High blood pressure increases the risk of bleeding inside the brain. Making diet and lifestyle changes to prevent atherosclerosis and high blood pressure lowers your risk of cerebrovascular disease. What nutrition changes can be made?  Eat more fruits, vegetables, and whole grains.  Reduce how much saturated fat you eat. To do this, eat less red meat and fewer full-fat dairy products.  Eat healthy proteins instead of red meat. Healthy proteins include: ? Fish. Eat fish that contains heart-healthy omega-3 fatty acids, twice a week. Examples include salmon, albacore tuna, mackerel, and herring. ? Chicken. ? Nuts. ? Low-fat or nonfat yogurt.  Avoid processed meats, like bacon and lunchmeat.  Avoid foods that contain: ? A lot of sugar, such as sweets and drinks with added sugar. ? A lot of salt (sodium). Avoid adding extra salt to your food, as told by your health care provider. ? Trans fats, such as margarine and baked goods. Trans fats may be listed as "partially hydrogenated oils" on food labels.  Check food labels to see how much sodium, sugar, and trans fats are in foods.  Use vegetable oils that contain low amounts of  saturated fat, such as olive oil or canola oil. What lifestyle changes can be made?  Drink alcohol in moderation. This means no more than 1 drink a day for nonpregnant women and 2 drinks a day for men. One drink equals 12 oz of beer, 5 oz of wine, or 1 oz of hard liquor.  If you are overweight, ask your health care provider to recommend a weight-loss plan for you. Losing 5-10 lb (2.2-4.5 kg) can reduce your risk of diabetes, atherosclerosis, and high blood pressure.  Exercise for 30?60 minutes on most days, or as much as told by your health care provider. ? Do moderate-intensity exercise, such as brisk walking, bicycling, and water aerobics. Ask your health care provider which activities are safe for you.  Do not use any products that contain nicotine or tobacco, such as cigarettes and e-cigarettes. If you need help quitting, ask your health care provider. Why are these changes important? Making these changes lowers your risk of many diseases that can cause cerebrovascular disease and stroke. Stroke is a leading cause of death and disability. Making these changes also improves your overall health and quality of life. What can I do to lower my risk? The following factors make you more likely to develop cerebrovascular disease:  Being overweight.  Smoking.  Being physically inactive.    Eating a high-fat diet.  Having certain health conditions, such as: ? Diabetes. ? High blood pressure. ? Heart disease. ? Atherosclerosis. ? High cholesterol. ? Sickle cell disease.  Talk with your health care provider about your risk for cerebrovascular disease. Work with your health care provider to control diseases that you have that may contribute to cerebrovascular disease. Your health care provider may prescribe medicines to help prevent major causes of cerebrovascular disease. Where to find more information: Learn more about preventing cerebrovascular disease from:  National Heart, Lung, and  Blood Institute: www.nhlbi.nih.gov/health/health-topics/topics/stroke  Centers for Disease Control and Prevention: cdc.gov/stroke/about.htm  Summary  Cerebrovascular disease can lead to a stroke.  Atherosclerosis and high blood pressure are major causes of cerebrovascular disease.  Making diet and lifestyle changes can reduce your risk of cerebrovascular disease.  Work with your health care provider to get your risk factors under control to reduce your risk of cerebrovascular disease. This information is not intended to replace advice given to you by your health care provider. Make sure you discuss any questions you have with your health care provider. Document Released: 03/10/2015 Document Revised: 09/13/2015 Document Reviewed: 03/10/2015 Elsevier Interactive Patient Education  2018 Elsevier Inc.  

## 2016-09-30 NOTE — Progress Notes (Signed)
Vitals:   09/30/16 1441 09/30/16 1442  BP: (!) 142/74 126/75  Pulse: 64 64  Resp: 18   Temp: 97.6 F (36.4 C)   SpO2: 96%   Weight: 165 lb (74.8 kg)   Height: 5\' 9"  (1.753 m)

## 2016-10-28 NOTE — Addendum Note (Signed)
Addended by: Lianne Cure A on: 10/28/2016 01:10 PM   Modules accepted: Orders

## 2016-10-29 DIAGNOSIS — K219 Gastro-esophageal reflux disease without esophagitis: Secondary | ICD-10-CM | POA: Diagnosis not present

## 2016-10-29 DIAGNOSIS — E782 Mixed hyperlipidemia: Secondary | ICD-10-CM | POA: Diagnosis not present

## 2016-10-29 DIAGNOSIS — Z9189 Other specified personal risk factors, not elsewhere classified: Secondary | ICD-10-CM | POA: Diagnosis not present

## 2016-10-29 DIAGNOSIS — I10 Essential (primary) hypertension: Secondary | ICD-10-CM | POA: Diagnosis not present

## 2016-10-29 DIAGNOSIS — E1165 Type 2 diabetes mellitus with hyperglycemia: Secondary | ICD-10-CM | POA: Diagnosis not present

## 2016-10-29 DIAGNOSIS — M810 Age-related osteoporosis without current pathological fracture: Secondary | ICD-10-CM | POA: Diagnosis not present

## 2016-11-02 DIAGNOSIS — E782 Mixed hyperlipidemia: Secondary | ICD-10-CM | POA: Diagnosis not present

## 2016-11-02 DIAGNOSIS — E1165 Type 2 diabetes mellitus with hyperglycemia: Secondary | ICD-10-CM | POA: Diagnosis not present

## 2016-11-02 DIAGNOSIS — I10 Essential (primary) hypertension: Secondary | ICD-10-CM | POA: Diagnosis not present

## 2016-11-02 DIAGNOSIS — Z6824 Body mass index (BMI) 24.0-24.9, adult: Secondary | ICD-10-CM | POA: Diagnosis not present

## 2016-11-02 DIAGNOSIS — Z1212 Encounter for screening for malignant neoplasm of rectum: Secondary | ICD-10-CM | POA: Diagnosis not present

## 2016-11-02 DIAGNOSIS — K219 Gastro-esophageal reflux disease without esophagitis: Secondary | ICD-10-CM | POA: Diagnosis not present

## 2016-11-02 DIAGNOSIS — Z9189 Other specified personal risk factors, not elsewhere classified: Secondary | ICD-10-CM | POA: Diagnosis not present

## 2016-11-02 DIAGNOSIS — M810 Age-related osteoporosis without current pathological fracture: Secondary | ICD-10-CM | POA: Diagnosis not present

## 2016-12-02 DIAGNOSIS — Z23 Encounter for immunization: Secondary | ICD-10-CM | POA: Diagnosis not present

## 2017-01-08 DIAGNOSIS — Z6824 Body mass index (BMI) 24.0-24.9, adult: Secondary | ICD-10-CM | POA: Diagnosis not present

## 2017-01-08 DIAGNOSIS — K600 Acute anal fissure: Secondary | ICD-10-CM | POA: Diagnosis not present

## 2017-03-11 DIAGNOSIS — E1165 Type 2 diabetes mellitus with hyperglycemia: Secondary | ICD-10-CM | POA: Diagnosis not present

## 2017-03-11 DIAGNOSIS — Z9189 Other specified personal risk factors, not elsewhere classified: Secondary | ICD-10-CM | POA: Diagnosis not present

## 2017-03-11 DIAGNOSIS — E782 Mixed hyperlipidemia: Secondary | ICD-10-CM | POA: Diagnosis not present

## 2017-03-11 DIAGNOSIS — I10 Essential (primary) hypertension: Secondary | ICD-10-CM | POA: Diagnosis not present

## 2017-03-11 DIAGNOSIS — M810 Age-related osteoporosis without current pathological fracture: Secondary | ICD-10-CM | POA: Diagnosis not present

## 2017-03-11 DIAGNOSIS — K219 Gastro-esophageal reflux disease without esophagitis: Secondary | ICD-10-CM | POA: Diagnosis not present

## 2017-03-15 DIAGNOSIS — M810 Age-related osteoporosis without current pathological fracture: Secondary | ICD-10-CM | POA: Diagnosis not present

## 2017-03-15 DIAGNOSIS — Z6824 Body mass index (BMI) 24.0-24.9, adult: Secondary | ICD-10-CM | POA: Diagnosis not present

## 2017-03-15 DIAGNOSIS — Z23 Encounter for immunization: Secondary | ICD-10-CM | POA: Diagnosis not present

## 2017-03-15 DIAGNOSIS — E782 Mixed hyperlipidemia: Secondary | ICD-10-CM | POA: Diagnosis not present

## 2017-03-15 DIAGNOSIS — M65332 Trigger finger, left middle finger: Secondary | ICD-10-CM | POA: Diagnosis not present

## 2017-03-15 DIAGNOSIS — E1165 Type 2 diabetes mellitus with hyperglycemia: Secondary | ICD-10-CM | POA: Diagnosis not present

## 2017-03-15 DIAGNOSIS — I10 Essential (primary) hypertension: Secondary | ICD-10-CM | POA: Diagnosis not present

## 2017-03-15 DIAGNOSIS — Z1389 Encounter for screening for other disorder: Secondary | ICD-10-CM | POA: Diagnosis not present

## 2017-03-22 DIAGNOSIS — L570 Actinic keratosis: Secondary | ICD-10-CM | POA: Diagnosis not present

## 2017-03-23 DIAGNOSIS — H2513 Age-related nuclear cataract, bilateral: Secondary | ICD-10-CM | POA: Diagnosis not present

## 2017-04-12 DIAGNOSIS — H25013 Cortical age-related cataract, bilateral: Secondary | ICD-10-CM | POA: Diagnosis not present

## 2017-04-12 DIAGNOSIS — H2512 Age-related nuclear cataract, left eye: Secondary | ICD-10-CM | POA: Diagnosis not present

## 2017-04-12 DIAGNOSIS — H43812 Vitreous degeneration, left eye: Secondary | ICD-10-CM | POA: Diagnosis not present

## 2017-04-12 DIAGNOSIS — H40013 Open angle with borderline findings, low risk, bilateral: Secondary | ICD-10-CM | POA: Diagnosis not present

## 2017-04-12 DIAGNOSIS — H2513 Age-related nuclear cataract, bilateral: Secondary | ICD-10-CM | POA: Diagnosis not present

## 2017-04-20 DIAGNOSIS — H2512 Age-related nuclear cataract, left eye: Secondary | ICD-10-CM | POA: Diagnosis not present

## 2017-04-20 DIAGNOSIS — H25812 Combined forms of age-related cataract, left eye: Secondary | ICD-10-CM | POA: Diagnosis not present

## 2017-05-12 DIAGNOSIS — H25011 Cortical age-related cataract, right eye: Secondary | ICD-10-CM | POA: Diagnosis not present

## 2017-05-12 DIAGNOSIS — H2511 Age-related nuclear cataract, right eye: Secondary | ICD-10-CM | POA: Diagnosis not present

## 2017-05-18 DIAGNOSIS — H2511 Age-related nuclear cataract, right eye: Secondary | ICD-10-CM | POA: Diagnosis not present

## 2017-05-18 DIAGNOSIS — H25811 Combined forms of age-related cataract, right eye: Secondary | ICD-10-CM | POA: Diagnosis not present

## 2017-09-16 DIAGNOSIS — M810 Age-related osteoporosis without current pathological fracture: Secondary | ICD-10-CM | POA: Diagnosis not present

## 2017-09-16 DIAGNOSIS — Z6824 Body mass index (BMI) 24.0-24.9, adult: Secondary | ICD-10-CM | POA: Diagnosis not present

## 2017-09-16 DIAGNOSIS — E1165 Type 2 diabetes mellitus with hyperglycemia: Secondary | ICD-10-CM | POA: Diagnosis not present

## 2017-09-16 DIAGNOSIS — E782 Mixed hyperlipidemia: Secondary | ICD-10-CM | POA: Diagnosis not present

## 2017-09-16 DIAGNOSIS — Z0001 Encounter for general adult medical examination with abnormal findings: Secondary | ICD-10-CM | POA: Diagnosis not present

## 2017-09-16 DIAGNOSIS — I10 Essential (primary) hypertension: Secondary | ICD-10-CM | POA: Diagnosis not present

## 2017-09-16 DIAGNOSIS — Z1212 Encounter for screening for malignant neoplasm of rectum: Secondary | ICD-10-CM | POA: Diagnosis not present

## 2017-09-16 DIAGNOSIS — K219 Gastro-esophageal reflux disease without esophagitis: Secondary | ICD-10-CM | POA: Diagnosis not present

## 2017-09-20 DIAGNOSIS — D485 Neoplasm of uncertain behavior of skin: Secondary | ICD-10-CM | POA: Diagnosis not present

## 2017-09-20 DIAGNOSIS — C44719 Basal cell carcinoma of skin of left lower limb, including hip: Secondary | ICD-10-CM | POA: Diagnosis not present

## 2017-09-20 DIAGNOSIS — L570 Actinic keratosis: Secondary | ICD-10-CM | POA: Diagnosis not present

## 2017-09-30 DIAGNOSIS — C44719 Basal cell carcinoma of skin of left lower limb, including hip: Secondary | ICD-10-CM | POA: Diagnosis not present

## 2017-10-07 DIAGNOSIS — Z13820 Encounter for screening for osteoporosis: Secondary | ICD-10-CM | POA: Diagnosis not present

## 2017-10-07 DIAGNOSIS — M810 Age-related osteoporosis without current pathological fracture: Secondary | ICD-10-CM | POA: Diagnosis not present

## 2017-12-21 DIAGNOSIS — Z23 Encounter for immunization: Secondary | ICD-10-CM | POA: Diagnosis not present

## 2018-01-19 DIAGNOSIS — S233XXA Sprain of ligaments of thoracic spine, initial encounter: Secondary | ICD-10-CM | POA: Diagnosis not present

## 2018-01-19 DIAGNOSIS — M9901 Segmental and somatic dysfunction of cervical region: Secondary | ICD-10-CM | POA: Diagnosis not present

## 2018-01-19 DIAGNOSIS — M47812 Spondylosis without myelopathy or radiculopathy, cervical region: Secondary | ICD-10-CM | POA: Diagnosis not present

## 2018-01-19 DIAGNOSIS — M47816 Spondylosis without myelopathy or radiculopathy, lumbar region: Secondary | ICD-10-CM | POA: Diagnosis not present

## 2018-01-19 DIAGNOSIS — M9903 Segmental and somatic dysfunction of lumbar region: Secondary | ICD-10-CM | POA: Diagnosis not present

## 2018-01-19 DIAGNOSIS — M9902 Segmental and somatic dysfunction of thoracic region: Secondary | ICD-10-CM | POA: Diagnosis not present

## 2018-01-20 DIAGNOSIS — M9903 Segmental and somatic dysfunction of lumbar region: Secondary | ICD-10-CM | POA: Diagnosis not present

## 2018-01-20 DIAGNOSIS — S233XXA Sprain of ligaments of thoracic spine, initial encounter: Secondary | ICD-10-CM | POA: Diagnosis not present

## 2018-01-20 DIAGNOSIS — M9902 Segmental and somatic dysfunction of thoracic region: Secondary | ICD-10-CM | POA: Diagnosis not present

## 2018-01-20 DIAGNOSIS — M47816 Spondylosis without myelopathy or radiculopathy, lumbar region: Secondary | ICD-10-CM | POA: Diagnosis not present

## 2018-01-20 DIAGNOSIS — M47812 Spondylosis without myelopathy or radiculopathy, cervical region: Secondary | ICD-10-CM | POA: Diagnosis not present

## 2018-01-20 DIAGNOSIS — M9901 Segmental and somatic dysfunction of cervical region: Secondary | ICD-10-CM | POA: Diagnosis not present

## 2018-01-21 DIAGNOSIS — M9903 Segmental and somatic dysfunction of lumbar region: Secondary | ICD-10-CM | POA: Diagnosis not present

## 2018-01-21 DIAGNOSIS — M9901 Segmental and somatic dysfunction of cervical region: Secondary | ICD-10-CM | POA: Diagnosis not present

## 2018-01-21 DIAGNOSIS — S233XXA Sprain of ligaments of thoracic spine, initial encounter: Secondary | ICD-10-CM | POA: Diagnosis not present

## 2018-01-21 DIAGNOSIS — M47816 Spondylosis without myelopathy or radiculopathy, lumbar region: Secondary | ICD-10-CM | POA: Diagnosis not present

## 2018-01-21 DIAGNOSIS — M9902 Segmental and somatic dysfunction of thoracic region: Secondary | ICD-10-CM | POA: Diagnosis not present

## 2018-01-21 DIAGNOSIS — M47812 Spondylosis without myelopathy or radiculopathy, cervical region: Secondary | ICD-10-CM | POA: Diagnosis not present

## 2018-01-24 DIAGNOSIS — M9903 Segmental and somatic dysfunction of lumbar region: Secondary | ICD-10-CM | POA: Diagnosis not present

## 2018-01-24 DIAGNOSIS — M545 Low back pain: Secondary | ICD-10-CM | POA: Diagnosis not present

## 2018-01-24 DIAGNOSIS — M47816 Spondylosis without myelopathy or radiculopathy, lumbar region: Secondary | ICD-10-CM | POA: Diagnosis not present

## 2018-01-25 DIAGNOSIS — M9903 Segmental and somatic dysfunction of lumbar region: Secondary | ICD-10-CM | POA: Diagnosis not present

## 2018-01-25 DIAGNOSIS — M545 Low back pain: Secondary | ICD-10-CM | POA: Diagnosis not present

## 2018-01-25 DIAGNOSIS — M47816 Spondylosis without myelopathy or radiculopathy, lumbar region: Secondary | ICD-10-CM | POA: Diagnosis not present

## 2018-01-27 DIAGNOSIS — M9903 Segmental and somatic dysfunction of lumbar region: Secondary | ICD-10-CM | POA: Diagnosis not present

## 2018-01-27 DIAGNOSIS — M545 Low back pain: Secondary | ICD-10-CM | POA: Diagnosis not present

## 2018-01-27 DIAGNOSIS — M47816 Spondylosis without myelopathy or radiculopathy, lumbar region: Secondary | ICD-10-CM | POA: Diagnosis not present

## 2018-02-01 DIAGNOSIS — M9902 Segmental and somatic dysfunction of thoracic region: Secondary | ICD-10-CM | POA: Diagnosis not present

## 2018-02-01 DIAGNOSIS — S233XXA Sprain of ligaments of thoracic spine, initial encounter: Secondary | ICD-10-CM | POA: Diagnosis not present

## 2018-02-01 DIAGNOSIS — M47816 Spondylosis without myelopathy or radiculopathy, lumbar region: Secondary | ICD-10-CM | POA: Diagnosis not present

## 2018-02-01 DIAGNOSIS — M9903 Segmental and somatic dysfunction of lumbar region: Secondary | ICD-10-CM | POA: Diagnosis not present

## 2018-02-01 DIAGNOSIS — M9901 Segmental and somatic dysfunction of cervical region: Secondary | ICD-10-CM | POA: Diagnosis not present

## 2018-02-01 DIAGNOSIS — M47812 Spondylosis without myelopathy or radiculopathy, cervical region: Secondary | ICD-10-CM | POA: Diagnosis not present

## 2018-03-14 DIAGNOSIS — I10 Essential (primary) hypertension: Secondary | ICD-10-CM | POA: Diagnosis not present

## 2018-03-14 DIAGNOSIS — Z9189 Other specified personal risk factors, not elsewhere classified: Secondary | ICD-10-CM | POA: Diagnosis not present

## 2018-03-14 DIAGNOSIS — R5383 Other fatigue: Secondary | ICD-10-CM | POA: Diagnosis not present

## 2018-03-14 DIAGNOSIS — K219 Gastro-esophageal reflux disease without esophagitis: Secondary | ICD-10-CM | POA: Diagnosis not present

## 2018-03-14 DIAGNOSIS — E1165 Type 2 diabetes mellitus with hyperglycemia: Secondary | ICD-10-CM | POA: Diagnosis not present

## 2018-03-14 DIAGNOSIS — E782 Mixed hyperlipidemia: Secondary | ICD-10-CM | POA: Diagnosis not present

## 2018-03-16 DIAGNOSIS — K219 Gastro-esophageal reflux disease without esophagitis: Secondary | ICD-10-CM | POA: Diagnosis not present

## 2018-03-16 DIAGNOSIS — E1165 Type 2 diabetes mellitus with hyperglycemia: Secondary | ICD-10-CM | POA: Diagnosis not present

## 2018-03-16 DIAGNOSIS — E782 Mixed hyperlipidemia: Secondary | ICD-10-CM | POA: Diagnosis not present

## 2018-03-16 DIAGNOSIS — Z6823 Body mass index (BMI) 23.0-23.9, adult: Secondary | ICD-10-CM | POA: Diagnosis not present

## 2018-03-16 DIAGNOSIS — I10 Essential (primary) hypertension: Secondary | ICD-10-CM | POA: Diagnosis not present

## 2018-03-16 DIAGNOSIS — M810 Age-related osteoporosis without current pathological fracture: Secondary | ICD-10-CM | POA: Diagnosis not present

## 2018-03-23 DIAGNOSIS — L570 Actinic keratosis: Secondary | ICD-10-CM | POA: Diagnosis not present

## 2018-04-07 ENCOUNTER — Encounter: Payer: Self-pay | Admitting: Family

## 2018-04-07 ENCOUNTER — Ambulatory Visit (HOSPITAL_COMMUNITY)
Admission: RE | Admit: 2018-04-07 | Discharge: 2018-04-07 | Disposition: A | Discharge status: Home / Self Care | Payer: Medicare Other | Source: Ambulatory Visit | Attending: Family | Admitting: Family

## 2018-04-07 ENCOUNTER — Ambulatory Visit (INDEPENDENT_AMBULATORY_CARE_PROVIDER_SITE_OTHER): Payer: Medicare Other | Admitting: Physician Assistant

## 2018-04-07 ENCOUNTER — Other Ambulatory Visit: Payer: Self-pay

## 2018-04-07 VITALS — BP 129/72 | HR 66 | Temp 97.3°F | Resp 18 | Ht 68.0 in | Wt 164.0 lb

## 2018-04-07 DIAGNOSIS — Z9889 Other specified postprocedural states: Secondary | ICD-10-CM

## 2018-04-07 DIAGNOSIS — I6523 Occlusion and stenosis of bilateral carotid arteries: Secondary | ICD-10-CM | POA: Insufficient documentation

## 2018-04-07 DIAGNOSIS — I6521 Occlusion and stenosis of right carotid artery: Secondary | ICD-10-CM | POA: Diagnosis not present

## 2018-04-07 NOTE — Progress Notes (Signed)
History of Present Illness:  Patient is a 78 y.o. year old male who presents for evaluation of carotid stenosis. He is s/p right carotid endarterectomy 01/30/13 by Dr. Oneida Alar. The patient denies symptoms of TIA, amaurosis, or stroke.  The patient is currently on aspirin antiplatelet therapy.     Other medical problems include HTN, hyperlipidemia, and hx of carotid stenosis.    Past Medical History:  Diagnosis Date  . Arthritis   . Cancer ([place])    skin CA removed from neck,ear, back, hand  . Carotid artery occlusion   . Diabetes mellitus without complication ([place])   . Erectile dysfunction   . HOH (hard of hearing)   . Hyperlipidemia   . Hypertension   . Insomnia     Past Surgical History:  Procedure Laterality Date  . COLONOSCOPY    . ENDARTERECTOMY Right 01/30/2013   Procedure: ENDARTERECTOMY CAROTID-RIGHT;  Surgeon: Elam Dutch, MD;  Location: [facility];  Service: Vascular;  Laterality: Right;  . HEMORRHOID SURGERY    . PATCH ANGIOPLASTY Right 01/30/2013   Procedure: RIGHT CAROTID ARTERY PATCH ANGIOPLASTY USING HEMASHIELD PATCH;  Surgeon: Elam Dutch, MD;  Location: [facility];  Service: Vascular;  Laterality: Right;  . TONSILLECTOMY     as a child     Social History Social History   Tobacco Use  . Smoking status: Former Smoker    Last attempt to quit: 01/19/1993    Years since quitting: 25.2  . Smokeless tobacco: Never Used  Substance Use Topics  . Alcohol use: Yes    Alcohol/week: 16.0 - 20.0 standard drinks    Types: 2 - 3 Glasses of wine, 2 - 3 Cans of beer, 12 - 14 Shots of liquor per week    Comment: weekly  . Drug use: No    Family History Family History  Problem Relation Age of Onset  . Diabetes Father   . Heart disease Father   . Hyperlipidemia Father   . Heart attack Father   . Hypertension Father     Allergies  Allergies  Allergen Reactions  . Bupropion Palpitations     Current Outpatient Medications  Medication Sig Dispense  Refill  . alendronate (FOSAMAX) 70 MG tablet Take 70 mg by mouth once a week. Take with a full glass of water on an empty stomach on Fridays    . atorvastatin (LIPITOR) 20 MG tablet Take 20 mg by mouth daily.    . cholecalciferol (VITAMIN D) 1000 UNITS tablet Take 1,000 Units by mouth daily. Reported on 09/26/2015    . lisinopril-hydrochlorothiazide (PRINZIDE,ZESTORETIC) 10-12.5 MG per tablet Take 1 tablet by mouth daily.    Marland Kitchen lovastatin (MEVACOR) 20 MG tablet Take 40 mg by mouth daily after supper.     . Multiple Vitamin (MULTIVITAMIN WITH MINERALS) TABS tablet Take 1 tablet by mouth daily.    . naproxen sodium (ALEVE) 220 MG tablet Take 220 mg by mouth daily.    . sildenafil (VIAGRA) 100 MG tablet Take 100 mg by mouth daily as needed for erectile dysfunction.    Marland Kitchen zolpidem (AMBIEN) 10 MG tablet Take 10 mg by mouth at bedtime.     Marland Kitchen aspirin EC 81 MG tablet Take 81 mg by mouth daily.    Marland Kitchen oxyCODONE-acetaminophen (ROXICET) 5-325 MG per tablet Take 1-2 tablets by mouth every 4 (four) hours as needed for severe pain. (Patient not taking: Reported on 09/30/2016) 30 tablet 0   No current facility-administered medications for  this visit.     ROS:   General:  No weight loss, Fever, chills  HEENT: No recent headaches, no nasal bleeding, no visual changes, no sore throat  Neurologic: No dizziness, blackouts, seizures. No recent symptoms of stroke or mini- stroke. No recent episodes of slurred speech, or temporary blindness.  Cardiac: No recent episodes of chest pain/pressure, no shortness of breath at rest.  No shortness of breath with exertion.  Denies history of atrial fibrillation or irregular heartbeat  Vascular: No history of rest pain in feet.  No history of claudication.  No history of non-healing ulcer, No history of DVT   Pulmonary: No home oxygen, no productive cough, no hemoptysis,  No asthma or wheezing  Musculoskeletal:  [x ] Arthritis, [ ]  Low back pain,  [ ]  Joint  pain  Hematologic:No history of hypercoagulable state.  No history of easy bleeding.  No history of anemia  Gastrointestinal: No hematochezia or melena,  No gastroesophageal reflux, no trouble swallowing  Urinary: [ ]  chronic Kidney disease, [ ]  on HD - [ ]  MWF or [ ]  TTHS, [ ]  Burning with urination, [ ]  Frequent urination, [ ]  Difficulty urinating;   Skin: No rashes  Psychological: No history of anxiety,  No history of depression   Physical Examination  Vitals:   04/07/18 1123 04/07/18 1127  BP: 124/73 129/72  Pulse: 64 66  Resp: 18   Temp: (!) 97.3 F (36.3 C)   TempSrc: Oral   SpO2: 97%   Weight: 164 lb (74.4 kg)   Height: 5\' 8"  (1.727 m)     Body mass index is 24.94 kg/m.  General:  Alert and oriented, no acute distress HEENT: Normal Neck: No bruit or JVD Pulmonary: Clear to auscultation bilaterally Cardiac: Regular Rate and Rhythm without murmur Gastrointestinal: Soft, non-tender, non-distended, no mass, no scars Skin: No rash Extremity Pulses:  2+ radial, brachial, femoral, dorsalis pedis, pulses bilaterally Musculoskeletal: No deformity or edema  Neurologic: Upper and lower extremity motor grossly symmetric  DATA:    Right Carotid Findings: +----------+--------+--------+--------+------------+--------+           PSV cm/sEDV cm/sStenosisDescribe    Comments +----------+--------+--------+--------+------------+--------+ CCA Prox  99      21                                   +----------+--------+--------+--------+------------+--------+ CCA Mid   114     22                                   +----------+--------+--------+--------+------------+--------+ CCA Distal109     22              heterogenous         +----------+--------+--------+--------+------------+--------+ ICA Prox  105     9       1-39%                        +----------+--------+--------+--------+------------+--------+ ICA Mid   107     27                                    +----------+--------+--------+--------+------------+--------+ ICA Distal101     26                                   +----------+--------+--------+--------+------------+--------+  ECA       125     3               homogeneous          +----------+--------+--------+--------+------------+--------+  +----------+--------+-------+----------------+-------------------+           PSV cm/sEDV cmsDescribe        Arm Pressure (mmHG) +----------+--------+-------+----------------+-------------------+ [id number]            Multiphasic, WNL                    +----------+--------+-------+----------------+-------------------+  +---------+--------+--+--------+---------+ VertebralPSV cm/s46EDV cm/sAntegrade +---------+--------+--+--------+---------+    Left Carotid Findings: +----------+--------+--------+--------+------------+--------+           PSV cm/sEDV cm/sStenosisDescribe    Comments +----------+--------+--------+--------+------------+--------+ CCA Prox  142     23                                   +----------+--------+--------+--------+------------+--------+ CCA Mid   138     31                                   +----------+--------+--------+--------+------------+--------+ CCA Distal118     26              heterogenous         +----------+--------+--------+--------+------------+--------+ ICA Prox  153     30              heterogenous         +----------+--------+--------+--------+------------+--------+ ICA Mid   101     20                                   +----------+--------+--------+--------+------------+--------+ ICA Distal104     29                                   +----------+--------+--------+--------+------------+--------+ ECA       162     10                                    +----------+--------+--------+--------+------------+--------+  +----------+--------+--------+----------------+-------------------+ SubclavianPSV cm/sEDV cm/sDescribe        Arm Pressure (mmHG) +----------+--------+--------+----------------+-------------------+           164             Multiphasic, WNL                    +----------+--------+--------+----------------+-------------------+  +---------+--------+--+--------+---------+ VertebralPSV cm/s53EDV cm/sAntegrade +---------+--------+--+--------+---------+    Summary: Right Carotid: Velocities in the right ICA are consistent with a 1-39% stenosis.  Left Carotid: Velocities in the left ICA are consistent with a 1-39% stenosis.  Vertebrals:  Bilateral vertebral arteries demonstrate antegrade flow. Subclavians: Normal flow hemodynamics were seen in bilateral subclavian              arteries.  ASSESSMENT:  Carotid stenosis with hx of  right carotid endarterectomy 01/30/13 by Dr. Oneida Alar   PLAN: He remains asymptomatic of symptoms of stroke or TIA.  His carotid duplex shows no significant changes since his last duplex.  No restenosis of the right ICA.  We reviewed symptoms of stroke and  if he has problems or concerns he will call otherwise he will f/u in 1 year for repeat carotid duplex.  Roxy Horseman PA-C [facility] Office: [phone]  MD in clinic: [facility]

## 2018-04-08 DIAGNOSIS — R05 Cough: Secondary | ICD-10-CM | POA: Diagnosis not present

## 2018-04-08 DIAGNOSIS — J069 Acute upper respiratory infection, unspecified: Secondary | ICD-10-CM | POA: Diagnosis not present

## 2018-04-08 DIAGNOSIS — Z6823 Body mass index (BMI) 23.0-23.9, adult: Secondary | ICD-10-CM | POA: Diagnosis not present

## 2018-09-15 DIAGNOSIS — E782 Mixed hyperlipidemia: Secondary | ICD-10-CM | POA: Diagnosis not present

## 2018-09-15 DIAGNOSIS — I10 Essential (primary) hypertension: Secondary | ICD-10-CM | POA: Diagnosis not present

## 2018-09-15 DIAGNOSIS — E1165 Type 2 diabetes mellitus with hyperglycemia: Secondary | ICD-10-CM | POA: Diagnosis not present

## 2018-09-15 DIAGNOSIS — R5383 Other fatigue: Secondary | ICD-10-CM | POA: Diagnosis not present

## 2018-09-19 DIAGNOSIS — E1165 Type 2 diabetes mellitus with hyperglycemia: Secondary | ICD-10-CM | POA: Diagnosis not present

## 2018-09-19 DIAGNOSIS — I10 Essential (primary) hypertension: Secondary | ICD-10-CM | POA: Diagnosis not present

## 2018-09-19 DIAGNOSIS — K219 Gastro-esophageal reflux disease without esophagitis: Secondary | ICD-10-CM | POA: Diagnosis not present

## 2018-09-19 DIAGNOSIS — E782 Mixed hyperlipidemia: Secondary | ICD-10-CM | POA: Diagnosis not present

## 2018-09-19 DIAGNOSIS — M810 Age-related osteoporosis without current pathological fracture: Secondary | ICD-10-CM | POA: Diagnosis not present

## 2018-09-21 DIAGNOSIS — L570 Actinic keratosis: Secondary | ICD-10-CM | POA: Diagnosis not present

## 2018-10-07 DIAGNOSIS — Z0001 Encounter for general adult medical examination with abnormal findings: Secondary | ICD-10-CM | POA: Diagnosis not present

## 2018-10-07 DIAGNOSIS — I10 Essential (primary) hypertension: Secondary | ICD-10-CM | POA: Diagnosis not present

## 2018-10-07 DIAGNOSIS — E782 Mixed hyperlipidemia: Secondary | ICD-10-CM | POA: Diagnosis not present

## 2018-12-08 DIAGNOSIS — Z23 Encounter for immunization: Secondary | ICD-10-CM | POA: Diagnosis not present

## 2019-01-05 DIAGNOSIS — L03314 Cellulitis of groin: Secondary | ICD-10-CM | POA: Diagnosis not present

## 2019-01-05 DIAGNOSIS — B356 Tinea cruris: Secondary | ICD-10-CM | POA: Diagnosis not present

## 2019-01-05 DIAGNOSIS — Z6823 Body mass index (BMI) 23.0-23.9, adult: Secondary | ICD-10-CM | POA: Diagnosis not present

## 2019-03-16 DIAGNOSIS — I10 Essential (primary) hypertension: Secondary | ICD-10-CM | POA: Diagnosis not present

## 2019-03-16 DIAGNOSIS — E349 Endocrine disorder, unspecified: Secondary | ICD-10-CM | POA: Diagnosis not present

## 2019-03-16 DIAGNOSIS — R5383 Other fatigue: Secondary | ICD-10-CM | POA: Diagnosis not present

## 2019-03-16 DIAGNOSIS — E1165 Type 2 diabetes mellitus with hyperglycemia: Secondary | ICD-10-CM | POA: Diagnosis not present

## 2019-03-16 DIAGNOSIS — R946 Abnormal results of thyroid function studies: Secondary | ICD-10-CM | POA: Diagnosis not present

## 2019-03-24 ENCOUNTER — Ambulatory Visit: Payer: Medicare Other | Attending: Internal Medicine

## 2019-03-24 DIAGNOSIS — Z23 Encounter for immunization: Secondary | ICD-10-CM | POA: Insufficient documentation

## 2019-03-24 NOTE — Progress Notes (Signed)
   Covid-19 Vaccination Clinic  Name:  Terrance Brown    MRN: [id number] DOB: [birth year]  03/24/2019  Terrance Brown was observed post Covid-19 immunization for 15 minutes without incidence. He was provided with Vaccine Information Sheet and instruction to access the V-Safe system.   Terrance Brown was instructed to call 911 with any severe reactions post vaccine: Marland Kitchen Difficulty breathing  . Swelling of your face and throat  . A fast heartbeat  . A bad rash all over your body  . Dizziness and weakness    Immunizations Administered    Name Date Dose VIS Date Route   Pfizer COVID-19 Vaccine 03/24/2019 10:10 AM 0.3 mL 02/17/2019 Intramuscular   Manufacturer: [employer]   Lot: [id number]   [place]: [id number]

## 2019-04-12 DIAGNOSIS — L570 Actinic keratosis: Secondary | ICD-10-CM | POA: Diagnosis not present

## 2019-04-14 ENCOUNTER — Ambulatory Visit: Payer: Medicare Other | Attending: Internal Medicine

## 2019-04-14 DIAGNOSIS — Z23 Encounter for immunization: Secondary | ICD-10-CM | POA: Insufficient documentation

## 2019-04-14 NOTE — Progress Notes (Signed)
   Covid-19 Vaccination Clinic  Name:  AHMARI HOADLEY    MRN: [id number] DOB: 23-May-1940  04/14/2019  Mr. Martelli was observed post Covid-19 immunization for 15 minutes without incidence. He was provided with Vaccine Information Sheet and instruction to access the V-Safe system.   Mr. Silveria was instructed to call 911 with any severe reactions post vaccine: Marland Kitchen Difficulty breathing  . Swelling of your face and throat  . A fast heartbeat  . A bad rash all over your body  . Dizziness and weakness    Immunizations Administered    Name Date Dose VIS Date Route   Pfizer COVID-19 Vaccine 04/14/2019 10:21 AM 0.3 mL 02/17/2019 Intramuscular   Manufacturer: [employer]   Lot: [id number]   [place]: [id number]

## 2019-04-21 ENCOUNTER — Encounter (HOSPITAL_COMMUNITY): Payer: Medicare Other

## 2019-04-21 ENCOUNTER — Ambulatory Visit: Payer: Medicare Other

## 2019-05-02 DIAGNOSIS — M47816 Spondylosis without myelopathy or radiculopathy, lumbar region: Secondary | ICD-10-CM | POA: Diagnosis not present

## 2019-05-02 DIAGNOSIS — S233XXA Sprain of ligaments of thoracic spine, initial encounter: Secondary | ICD-10-CM | POA: Diagnosis not present

## 2019-05-02 DIAGNOSIS — M9903 Segmental and somatic dysfunction of lumbar region: Secondary | ICD-10-CM | POA: Diagnosis not present

## 2019-05-02 DIAGNOSIS — M9901 Segmental and somatic dysfunction of cervical region: Secondary | ICD-10-CM | POA: Diagnosis not present

## 2019-05-02 DIAGNOSIS — M9902 Segmental and somatic dysfunction of thoracic region: Secondary | ICD-10-CM | POA: Diagnosis not present

## 2019-05-02 DIAGNOSIS — M47812 Spondylosis without myelopathy or radiculopathy, cervical region: Secondary | ICD-10-CM | POA: Diagnosis not present

## 2019-05-03 DIAGNOSIS — S233XXA Sprain of ligaments of thoracic spine, initial encounter: Secondary | ICD-10-CM | POA: Diagnosis not present

## 2019-05-03 DIAGNOSIS — M9901 Segmental and somatic dysfunction of cervical region: Secondary | ICD-10-CM | POA: Diagnosis not present

## 2019-05-03 DIAGNOSIS — M9903 Segmental and somatic dysfunction of lumbar region: Secondary | ICD-10-CM | POA: Diagnosis not present

## 2019-05-03 DIAGNOSIS — M47812 Spondylosis without myelopathy or radiculopathy, cervical region: Secondary | ICD-10-CM | POA: Diagnosis not present

## 2019-05-03 DIAGNOSIS — M47816 Spondylosis without myelopathy or radiculopathy, lumbar region: Secondary | ICD-10-CM | POA: Diagnosis not present

## 2019-05-03 DIAGNOSIS — M9902 Segmental and somatic dysfunction of thoracic region: Secondary | ICD-10-CM | POA: Diagnosis not present

## 2019-05-04 ENCOUNTER — Other Ambulatory Visit: Payer: Self-pay

## 2019-05-04 DIAGNOSIS — M47816 Spondylosis without myelopathy or radiculopathy, lumbar region: Secondary | ICD-10-CM | POA: Diagnosis not present

## 2019-05-04 DIAGNOSIS — M9903 Segmental and somatic dysfunction of lumbar region: Secondary | ICD-10-CM | POA: Diagnosis not present

## 2019-05-04 DIAGNOSIS — S233XXA Sprain of ligaments of thoracic spine, initial encounter: Secondary | ICD-10-CM | POA: Diagnosis not present

## 2019-05-04 DIAGNOSIS — M9901 Segmental and somatic dysfunction of cervical region: Secondary | ICD-10-CM | POA: Diagnosis not present

## 2019-05-04 DIAGNOSIS — M47812 Spondylosis without myelopathy or radiculopathy, cervical region: Secondary | ICD-10-CM | POA: Diagnosis not present

## 2019-05-04 DIAGNOSIS — I6521 Occlusion and stenosis of right carotid artery: Secondary | ICD-10-CM

## 2019-05-04 DIAGNOSIS — M9902 Segmental and somatic dysfunction of thoracic region: Secondary | ICD-10-CM | POA: Diagnosis not present

## 2019-05-05 ENCOUNTER — Ambulatory Visit (INDEPENDENT_AMBULATORY_CARE_PROVIDER_SITE_OTHER): Payer: Medicare Other | Admitting: Physician Assistant

## 2019-05-05 ENCOUNTER — Ambulatory Visit (HOSPITAL_COMMUNITY)
Admission: RE | Admit: 2019-05-05 | Discharge: 2019-05-05 | Disposition: A | Discharge status: Home / Self Care | Payer: Medicare Other | Source: Ambulatory Visit | Attending: Vascular Surgery | Admitting: Vascular Surgery

## 2019-05-05 ENCOUNTER — Other Ambulatory Visit: Payer: Self-pay

## 2019-05-05 VITALS — BP 125/77 | HR 62 | Temp 97.5°F | Resp 18 | Ht 68.0 in | Wt 162.0 lb

## 2019-05-05 DIAGNOSIS — I6523 Occlusion and stenosis of bilateral carotid arteries: Secondary | ICD-10-CM | POA: Diagnosis not present

## 2019-05-05 DIAGNOSIS — I6521 Occlusion and stenosis of right carotid artery: Secondary | ICD-10-CM | POA: Insufficient documentation

## 2019-05-05 DIAGNOSIS — I10 Essential (primary) hypertension: Secondary | ICD-10-CM | POA: Diagnosis not present

## 2019-05-05 DIAGNOSIS — E7849 Other hyperlipidemia: Secondary | ICD-10-CM | POA: Diagnosis not present

## 2019-05-05 DIAGNOSIS — E1165 Type 2 diabetes mellitus with hyperglycemia: Secondary | ICD-10-CM | POA: Diagnosis not present

## 2019-05-05 NOTE — Progress Notes (Signed)
Established Carotid Patient   History of Present Illness   Terrance Brown is a 79 y.o. (1940-08-20) male who presents for surveillance of carotid artery stenosis.  Surgical history significant for right carotid endarterectomy by Dr. Oneida Alar in 2014.  He denies any strokelike symptoms over the last year including slurring speech, changes in vision, or one-sided weakness.  He is taking an aspirin and a statin daily.  He denies tobacco use.  He sees primary care physician twice a year for management of chronic medical conditions including hypertension and hyperlipidemia.  The patient's PMH, PSH, SH, and FamHx were reviewed and are unchanged from prior visit.  Current Outpatient Medications  Medication Sig Dispense Refill  . alendronate (FOSAMAX) 70 MG tablet Take 70 mg by mouth once a week. Take with a full glass of water on an empty stomach on Fridays    . atorvastatin (LIPITOR) 20 MG tablet Take 20 mg by mouth daily.    . cholecalciferol (VITAMIN D) 1000 UNITS tablet Take 1,000 Units by mouth daily. Reported on 09/26/2015    . lisinopril-hydrochlorothiazide (PRINZIDE,ZESTORETIC) 10-12.5 MG per tablet Take 1 tablet by mouth daily.    Marland Kitchen lovastatin (MEVACOR) 20 MG tablet Take 40 mg by mouth daily after supper.     . Multiple Vitamin (MULTIVITAMIN WITH MINERALS) TABS tablet Take 1 tablet by mouth daily.    . naproxen sodium (ALEVE) 220 MG tablet Take 220 mg by mouth daily.    . sildenafil (VIAGRA) 100 MG tablet Take 100 mg by mouth daily as needed for erectile dysfunction.    Marland Kitchen zolpidem (AMBIEN) 10 MG tablet Take 10 mg by mouth at bedtime.     Marland Kitchen aspirin EC 81 MG tablet Take 81 mg by mouth daily.    Marland Kitchen oxyCODONE-acetaminophen (ROXICET) 5-325 MG per tablet Take 1-2 tablets by mouth every 4 (four) hours as needed for severe pain. (Patient not taking: Reported on 05/05/2019) 30 tablet 0   No current facility-administered medications for this visit.    On ROS today: Back pain, 10 system ROS otherwise  negative   Physical Examination   Vitals:   05/05/19 0834 05/05/19 0837  BP: 109/64 125/77  Pulse: 62 62  Resp: 16 18  Temp: (!) 97.5 F (36.4 C) (!) 97.5 F (36.4 C)  TempSrc: Temporal Temporal  SpO2: 98%   Weight: 162 lb (73.5 kg) 162 lb (73.5 kg)  Height: 5\' 8"  (1.727 m) 5\' 8"  (1.727 m)   Body mass index is 24.63 kg/m.  General Alert, O x 3, WD, NAD  Neck Supple, mid-line trachea,  healed R neck incision  Pulmonary Sym exp, good B air movt,   Cardiac RRR, Nl S1, S2,   Gastro- intestinal soft, non-distended, non-tender to palpation,   Musculo- skeletal M/S 5/5 throughout  , Extremities without ischemic changes    Neurologic Cranial nerves 2-12 intact ,    Non-Invasive Vascular Imaging   B Carotid Duplex :   R ICA stenosis:  1-39%  R VA:  patent and antegrade  L ICA stenosis:  1-39%  L VA:  patent and antegrade   Medical Decision Making   Terrance Brown is a 79 y.o. male who presents for surveillance of carotid artery stenosis   Carotid duplex findings unchanged over the past year with right ICA 1-39% stenosis and left ICA 1 to 39% stenosis  Repeat carotid duplex in 1 year  Continue aspirin and statin daily  Follow regularly with PCP for management of  hypertension and hyperlipidemia   Dagoberto Ligas PA-C [facility] Office: [phone]  Clinic MD: Donzetta Matters

## 2019-05-08 ENCOUNTER — Other Ambulatory Visit: Payer: Self-pay | Admitting: *Deleted

## 2019-05-08 DIAGNOSIS — I6523 Occlusion and stenosis of bilateral carotid arteries: Secondary | ICD-10-CM

## 2019-05-09 DIAGNOSIS — M9902 Segmental and somatic dysfunction of thoracic region: Secondary | ICD-10-CM | POA: Diagnosis not present

## 2019-05-09 DIAGNOSIS — M47816 Spondylosis without myelopathy or radiculopathy, lumbar region: Secondary | ICD-10-CM | POA: Diagnosis not present

## 2019-05-09 DIAGNOSIS — S233XXA Sprain of ligaments of thoracic spine, initial encounter: Secondary | ICD-10-CM | POA: Diagnosis not present

## 2019-05-09 DIAGNOSIS — M47812 Spondylosis without myelopathy or radiculopathy, cervical region: Secondary | ICD-10-CM | POA: Diagnosis not present

## 2019-05-09 DIAGNOSIS — M9901 Segmental and somatic dysfunction of cervical region: Secondary | ICD-10-CM | POA: Diagnosis not present

## 2019-05-09 DIAGNOSIS — M9903 Segmental and somatic dysfunction of lumbar region: Secondary | ICD-10-CM | POA: Diagnosis not present

## 2019-05-10 DIAGNOSIS — M9902 Segmental and somatic dysfunction of thoracic region: Secondary | ICD-10-CM | POA: Diagnosis not present

## 2019-05-10 DIAGNOSIS — M9901 Segmental and somatic dysfunction of cervical region: Secondary | ICD-10-CM | POA: Diagnosis not present

## 2019-05-10 DIAGNOSIS — M47812 Spondylosis without myelopathy or radiculopathy, cervical region: Secondary | ICD-10-CM | POA: Diagnosis not present

## 2019-05-10 DIAGNOSIS — M47816 Spondylosis without myelopathy or radiculopathy, lumbar region: Secondary | ICD-10-CM | POA: Diagnosis not present

## 2019-05-10 DIAGNOSIS — S233XXA Sprain of ligaments of thoracic spine, initial encounter: Secondary | ICD-10-CM | POA: Diagnosis not present

## 2019-05-10 DIAGNOSIS — M9903 Segmental and somatic dysfunction of lumbar region: Secondary | ICD-10-CM | POA: Diagnosis not present

## 2019-06-07 DIAGNOSIS — I10 Essential (primary) hypertension: Secondary | ICD-10-CM | POA: Diagnosis not present

## 2019-06-07 DIAGNOSIS — E7849 Other hyperlipidemia: Secondary | ICD-10-CM | POA: Diagnosis not present

## 2019-07-18 DIAGNOSIS — R519 Headache, unspecified: Secondary | ICD-10-CM | POA: Diagnosis not present

## 2019-07-18 DIAGNOSIS — E1165 Type 2 diabetes mellitus with hyperglycemia: Secondary | ICD-10-CM | POA: Diagnosis not present

## 2019-07-18 DIAGNOSIS — I6523 Occlusion and stenosis of bilateral carotid arteries: Secondary | ICD-10-CM | POA: Diagnosis not present

## 2019-07-18 DIAGNOSIS — Z6823 Body mass index (BMI) 23.0-23.9, adult: Secondary | ICD-10-CM | POA: Diagnosis not present

## 2019-07-18 DIAGNOSIS — R42 Dizziness and giddiness: Secondary | ICD-10-CM | POA: Diagnosis not present

## 2019-07-18 DIAGNOSIS — E782 Mixed hyperlipidemia: Secondary | ICD-10-CM | POA: Diagnosis not present

## 2019-07-18 DIAGNOSIS — I10 Essential (primary) hypertension: Secondary | ICD-10-CM | POA: Diagnosis not present

## 2019-08-23 DIAGNOSIS — H524 Presbyopia: Secondary | ICD-10-CM | POA: Diagnosis not present

## 2019-08-23 DIAGNOSIS — H43811 Vitreous degeneration, right eye: Secondary | ICD-10-CM | POA: Diagnosis not present

## 2019-09-20 DIAGNOSIS — I10 Essential (primary) hypertension: Secondary | ICD-10-CM | POA: Diagnosis not present

## 2019-09-20 DIAGNOSIS — M810 Age-related osteoporosis without current pathological fracture: Secondary | ICD-10-CM | POA: Diagnosis not present

## 2019-09-20 DIAGNOSIS — Z6823 Body mass index (BMI) 23.0-23.9, adult: Secondary | ICD-10-CM | POA: Diagnosis not present

## 2019-09-20 DIAGNOSIS — K219 Gastro-esophageal reflux disease without esophagitis: Secondary | ICD-10-CM | POA: Diagnosis not present

## 2019-09-20 DIAGNOSIS — E1165 Type 2 diabetes mellitus with hyperglycemia: Secondary | ICD-10-CM | POA: Diagnosis not present

## 2019-09-20 DIAGNOSIS — E782 Mixed hyperlipidemia: Secondary | ICD-10-CM | POA: Diagnosis not present

## 2019-09-20 DIAGNOSIS — R4582 Worries: Secondary | ICD-10-CM | POA: Diagnosis not present

## 2019-10-10 DIAGNOSIS — L570 Actinic keratosis: Secondary | ICD-10-CM | POA: Diagnosis not present

## 2019-12-04 DIAGNOSIS — Z23 Encounter for immunization: Secondary | ICD-10-CM | POA: Diagnosis not present

## 2019-12-06 DIAGNOSIS — M6208 Separation of muscle (nontraumatic), other site: Secondary | ICD-10-CM | POA: Diagnosis not present

## 2019-12-06 DIAGNOSIS — Z6823 Body mass index (BMI) 23.0-23.9, adult: Secondary | ICD-10-CM | POA: Diagnosis not present

## 2019-12-13 DIAGNOSIS — Z23 Encounter for immunization: Secondary | ICD-10-CM | POA: Diagnosis not present

## 2020-01-06 DIAGNOSIS — E7849 Other hyperlipidemia: Secondary | ICD-10-CM | POA: Diagnosis not present

## 2020-01-06 DIAGNOSIS — I6523 Occlusion and stenosis of bilateral carotid arteries: Secondary | ICD-10-CM | POA: Diagnosis not present

## 2020-01-06 DIAGNOSIS — Z87891 Personal history of nicotine dependence: Secondary | ICD-10-CM | POA: Diagnosis not present

## 2020-01-06 DIAGNOSIS — I10 Essential (primary) hypertension: Secondary | ICD-10-CM | POA: Diagnosis not present

## 2020-01-24 DIAGNOSIS — M47816 Spondylosis without myelopathy or radiculopathy, lumbar region: Secondary | ICD-10-CM | POA: Diagnosis not present

## 2020-01-24 DIAGNOSIS — M9903 Segmental and somatic dysfunction of lumbar region: Secondary | ICD-10-CM | POA: Diagnosis not present

## 2020-01-25 DIAGNOSIS — M9903 Segmental and somatic dysfunction of lumbar region: Secondary | ICD-10-CM | POA: Diagnosis not present

## 2020-01-25 DIAGNOSIS — M47816 Spondylosis without myelopathy or radiculopathy, lumbar region: Secondary | ICD-10-CM | POA: Diagnosis not present

## 2020-01-26 DIAGNOSIS — M47816 Spondylosis without myelopathy or radiculopathy, lumbar region: Secondary | ICD-10-CM | POA: Diagnosis not present

## 2020-01-26 DIAGNOSIS — M9903 Segmental and somatic dysfunction of lumbar region: Secondary | ICD-10-CM | POA: Diagnosis not present

## 2020-01-30 DIAGNOSIS — M9903 Segmental and somatic dysfunction of lumbar region: Secondary | ICD-10-CM | POA: Diagnosis not present

## 2020-01-30 DIAGNOSIS — M47816 Spondylosis without myelopathy or radiculopathy, lumbar region: Secondary | ICD-10-CM | POA: Diagnosis not present

## 2020-01-31 DIAGNOSIS — M9903 Segmental and somatic dysfunction of lumbar region: Secondary | ICD-10-CM | POA: Diagnosis not present

## 2020-01-31 DIAGNOSIS — M47816 Spondylosis without myelopathy or radiculopathy, lumbar region: Secondary | ICD-10-CM | POA: Diagnosis not present

## 2020-02-05 DIAGNOSIS — M47816 Spondylosis without myelopathy or radiculopathy, lumbar region: Secondary | ICD-10-CM | POA: Diagnosis not present

## 2020-02-05 DIAGNOSIS — M9903 Segmental and somatic dysfunction of lumbar region: Secondary | ICD-10-CM | POA: Diagnosis not present

## 2020-02-06 DIAGNOSIS — I10 Essential (primary) hypertension: Secondary | ICD-10-CM | POA: Diagnosis not present

## 2020-02-06 DIAGNOSIS — E1165 Type 2 diabetes mellitus with hyperglycemia: Secondary | ICD-10-CM | POA: Diagnosis not present

## 2020-02-06 DIAGNOSIS — E7849 Other hyperlipidemia: Secondary | ICD-10-CM | POA: Diagnosis not present

## 2020-02-07 DIAGNOSIS — M9903 Segmental and somatic dysfunction of lumbar region: Secondary | ICD-10-CM | POA: Diagnosis not present

## 2020-02-07 DIAGNOSIS — M47816 Spondylosis without myelopathy or radiculopathy, lumbar region: Secondary | ICD-10-CM | POA: Diagnosis not present

## 2020-03-18 DIAGNOSIS — E1165 Type 2 diabetes mellitus with hyperglycemia: Secondary | ICD-10-CM | POA: Diagnosis not present

## 2020-03-18 DIAGNOSIS — I10 Essential (primary) hypertension: Secondary | ICD-10-CM | POA: Diagnosis not present

## 2020-03-18 DIAGNOSIS — R5383 Other fatigue: Secondary | ICD-10-CM | POA: Diagnosis not present

## 2020-03-18 DIAGNOSIS — Z1329 Encounter for screening for other suspected endocrine disorder: Secondary | ICD-10-CM | POA: Diagnosis not present

## 2020-03-18 DIAGNOSIS — E7849 Other hyperlipidemia: Secondary | ICD-10-CM | POA: Diagnosis not present

## 2020-03-18 DIAGNOSIS — K219 Gastro-esophageal reflux disease without esophagitis: Secondary | ICD-10-CM | POA: Diagnosis not present

## 2020-03-21 DIAGNOSIS — M810 Age-related osteoporosis without current pathological fracture: Secondary | ICD-10-CM | POA: Diagnosis not present

## 2020-03-21 DIAGNOSIS — Z0001 Encounter for general adult medical examination with abnormal findings: Secondary | ICD-10-CM | POA: Diagnosis not present

## 2020-03-21 DIAGNOSIS — Z9189 Other specified personal risk factors, not elsewhere classified: Secondary | ICD-10-CM | POA: Diagnosis not present

## 2020-03-21 DIAGNOSIS — R4589 Other symptoms and signs involving emotional state: Secondary | ICD-10-CM | POA: Diagnosis not present

## 2020-03-21 DIAGNOSIS — I10 Essential (primary) hypertension: Secondary | ICD-10-CM | POA: Diagnosis not present

## 2020-03-21 DIAGNOSIS — Z6824 Body mass index (BMI) 24.0-24.9, adult: Secondary | ICD-10-CM | POA: Diagnosis not present

## 2020-03-21 DIAGNOSIS — E7849 Other hyperlipidemia: Secondary | ICD-10-CM | POA: Diagnosis not present

## 2020-03-21 DIAGNOSIS — E782 Mixed hyperlipidemia: Secondary | ICD-10-CM | POA: Diagnosis not present

## 2020-03-21 DIAGNOSIS — E1165 Type 2 diabetes mellitus with hyperglycemia: Secondary | ICD-10-CM | POA: Diagnosis not present

## 2020-03-21 DIAGNOSIS — K2100 Gastro-esophageal reflux disease with esophagitis, without bleeding: Secondary | ICD-10-CM | POA: Diagnosis not present

## 2020-04-24 DIAGNOSIS — L570 Actinic keratosis: Secondary | ICD-10-CM | POA: Diagnosis not present

## 2020-04-24 DIAGNOSIS — D485 Neoplasm of uncertain behavior of skin: Secondary | ICD-10-CM | POA: Diagnosis not present

## 2020-04-24 DIAGNOSIS — D0422 Carcinoma in situ of skin of left ear and external auricular canal: Secondary | ICD-10-CM | POA: Diagnosis not present

## 2020-05-07 ENCOUNTER — Ambulatory Visit (HOSPITAL_COMMUNITY)
Admission: RE | Admit: 2020-05-07 | Discharge: 2020-05-07 | Disposition: A | Discharge status: Home / Self Care | Payer: Medicare Other | Source: Ambulatory Visit | Attending: Physician Assistant | Admitting: Physician Assistant

## 2020-05-07 ENCOUNTER — Encounter: Payer: Self-pay | Admitting: Physician Assistant

## 2020-05-07 ENCOUNTER — Ambulatory Visit (INDEPENDENT_AMBULATORY_CARE_PROVIDER_SITE_OTHER): Payer: Medicare Other | Admitting: Physician Assistant

## 2020-05-07 ENCOUNTER — Other Ambulatory Visit: Payer: Self-pay

## 2020-05-07 VITALS — BP 140/62 | HR 56 | Temp 98.2°F | Resp 20 | Ht 68.0 in | Wt 166.5 lb

## 2020-05-07 DIAGNOSIS — I6523 Occlusion and stenosis of bilateral carotid arteries: Secondary | ICD-10-CM

## 2020-05-07 NOTE — Progress Notes (Signed)
History of Present Illness:  Patient is a 80 y.o. year old male who presents for evaluation of carotid stenosis.  S/P right CEA by Dr. Oneida Alar in 2014.  The patient denies symptoms of TIA, amaurosis, or stroke.    He is taking an aspirin and a statin daily.  He states active and eats well.    Past Medical History:  Diagnosis Date  . Arthritis   . Cancer ([address])    skin CA removed from neck,ear, back, hand  . Carotid artery occlusion   . Diabetes mellitus without complication ([place])   . Erectile dysfunction   . HOH (hard of hearing)   . Hyperlipidemia   . Hypertension   . Insomnia     Past Surgical History:  Procedure Laterality Date  . COLONOSCOPY    . ENDARTERECTOMY Right 01/30/2013   Procedure: ENDARTERECTOMY CAROTID-RIGHT;  Surgeon: Elam Dutch, MD;  Location: [facility];  Service: Vascular;  Laterality: Right;  . HEMORRHOID SURGERY    . PATCH ANGIOPLASTY Right 01/30/2013   Procedure: RIGHT CAROTID ARTERY PATCH ANGIOPLASTY USING HEMASHIELD PATCH;  Surgeon: Elam Dutch, MD;  Location: [facility] [place];  Service: Vascular;  Laterality: Right;  . TONSILLECTOMY     as a child     Social History Social History   Tobacco Use  . Smoking status: Former Smoker    Quit date: 01/19/1993    Years since quitting: 27.3  . Smokeless tobacco: Never Used  Substance Use Topics  . Alcohol use: Yes    Alcohol/week: 16.0 - 20.0 standard drinks    Types: 2 - 3 Glasses of wine, 2 - 3 Cans of beer, 12 - 14 Shots of liquor per week    Comment: weekly  . Drug use: No    Family History Family History  Problem Relation Age of Onset  . Diabetes Father   . Heart disease Father   . Hyperlipidemia Father   . Heart attack Father   . Hypertension Father     Allergies  Allergies  Allergen Reactions  . Bupropion Palpitations     Current Outpatient Medications  Medication Sig Dispense Refill  . alendronate (FOSAMAX) 70 MG tablet Take 70 mg by mouth once a week. Take with a full  glass of water on an empty stomach on Fridays    . aspirin EC 81 MG tablet Take 81 mg by mouth daily.    Marland Kitchen atorvastatin (LIPITOR) 20 MG tablet Take 20 mg by mouth daily.    . cholecalciferol (VITAMIN D) 1000 UNITS tablet Take 1,000 Units by mouth daily. Reported on 09/26/2015    . lisinopril-hydrochlorothiazide (PRINZIDE,ZESTORETIC) 10-12.5 MG per tablet Take 1 tablet by mouth daily.    Marland Kitchen lovastatin (MEVACOR) 20 MG tablet Take 40 mg by mouth daily after supper.    . Multiple Vitamin (MULTIVITAMIN WITH MINERALS) TABS tablet Take 1 tablet by mouth daily.    . naproxen sodium (ALEVE) 220 MG tablet Take 220 mg by mouth daily.    Marland Kitchen oxyCODONE-acetaminophen (ROXICET) 5-325 MG per tablet Take 1-2 tablets by mouth every 4 (four) hours as needed for severe pain. 30 tablet 0  . sildenafil (VIAGRA) 100 MG tablet Take 100 mg by mouth daily as needed for erectile dysfunction.    Marland Kitchen zolpidem (AMBIEN) 10 MG tablet Take 10 mg by mouth at bedtime.      No current facility-administered medications for this visit.    ROS:   General:  No weight loss, Fever,  chills  HEENT: No recent headaches, no nasal bleeding, no visual changes, no sore throat  Neurologic: No dizziness, blackouts, seizures. No recent symptoms of stroke or mini- stroke. No recent episodes of slurred speech, or temporary blindness.  Cardiac: No recent episodes of chest pain/pressure, no shortness of breath at rest.  No shortness of breath with exertion.  Denies history of atrial fibrillation or irregular heartbeat  Vascular: No history of rest pain in feet.  No history of claudication.  No history of non-healing ulcer, No history of DVT   Pulmonary: No home oxygen, no productive cough, no hemoptysis,  No asthma or wheezing  Musculoskeletal:  [ ]  Arthritis, [ ]  Low back pain,  [ ]  Joint pain  Hematologic:No history of hypercoagulable state.  No history of easy bleeding.  No history of anemia  Gastrointestinal: No hematochezia or melena,  No  gastroesophageal reflux, no trouble swallowing  Urinary: [ ]  chronic Kidney disease, [ ]  on HD - [ ]  MWF or [ ]  TTHS, [ ]  Burning with urination, [ ]  Frequent urination, [ ]  Difficulty urinating;   Skin: No rashes  Psychological: No history of anxiety,  No history of depression   Physical Examination  Vitals:   05/07/20 1419 05/07/20 1420  BP: 135/65 140/62  Pulse: (!) 56   Resp: 20   Temp: 98.2 F (36.8 C)   TempSrc: Temporal   SpO2: 98%   Weight: 166 lb 8 oz (75.5 kg)   Height: 5\' 8"  (1.727 m)     Body mass index is 25.32 kg/m.  General:  Alert and oriented, no acute distress HEENT: Normal Neck: No bruit or JVD Pulmonary: Clear to auscultation bilaterally Cardiac: Regular Rate and Rhythm without murmur Gastrointestinal: Soft, non-tender, non-distended, no mass, no scars Skin: No rash Musculoskeletal: No deformity or edema  Neurologic: Upper and lower extremity motor 5/5 and symmetric  DATA:    Right Carotid Findings:  +----------+--------+--------+--------+------------------+--------+       PSV cm/sEDV cm/sStenosisPlaque DescriptionComments  +----------+--------+--------+--------+------------------+--------+  CCA Prox 96   14                      +----------+--------+--------+--------+------------------+--------+  CCA Mid  120   20       heterogenous         +----------+--------+--------+--------+------------------+--------+  CCA Distal109   20       heterogenous         +----------+--------+--------+--------+------------------+--------+  ICA Prox 87   16                      +----------+--------+--------+--------+------------------+--------+  ICA Mid  103   25                      +----------+--------+--------+--------+------------------+--------+  ICA Distal105   28                       +----------+--------+--------+--------+------------------+--------+  ECA    113   3                       +----------+--------+--------+--------+------------------+--------+   +----------+--------+-------+---------+-------------------+       PSV cm/sEDV cmsDescribe Arm Pressure (mmHG)  +----------+--------+-------+---------+-------------------+  Subclavian218       Turbulent            +----------+--------+-------+---------+-------------------+   +---------+--------+--+--------+---------+  VertebralPSV cm/s52EDV cm/sAntegrade  +---------+--------+--+--------+---------+      Left Carotid Findings:  +----------+--------+--------+--------+------------------+--------+  PSV cm/sEDV cm/sStenosisPlaque DescriptionComments  +----------+--------+--------+--------+------------------+--------+  CCA Prox 134   20                      +----------+--------+--------+--------+------------------+--------+  CCA Mid  132   22                      +----------+--------+--------+--------+------------------+--------+  CCA Distal132   24       heterogenous         +----------+--------+--------+--------+------------------+--------+  ICA Prox 131   38       heterogenous         +----------+--------+--------+--------+------------------+--------+  ICA Mid  136   23                      +----------+--------+--------+--------+------------------+--------+  ICA Distal125   35                      +----------+--------+--------+--------+------------------+--------+  ECA    136   0        heterogenous         +----------+--------+--------+--------+------------------+--------+    +----------+--------+--------+----------------+-------------------+       PSV cm/sEDV cm/sDescribe    Arm Pressure (mmHG)  +----------+--------+--------+----------------+-------------------+  Subclavian182       Multiphasic, WNL            +----------+--------+--------+----------------+-------------------+   +---------+--------+--+--------+--+---------+  VertebralPSV cm/s49EDV cm/s10Antegrade  +---------+--------+--+--------+--+---------+    Summary:  Right Carotid: Velocities in the right ICA are consistent with a 1-39%  stenosis.   Left Carotid: Velocities in the left ICA are consistent with a 1-39%  stenosis.   Vertebrals: Bilateral vertebral arteries demonstrate antegrade flow.  Subclavians: Right subclavian artery flow was disturbed. Normal flow        hemodynamics were seen in the left subclavian artery.   ASSESSMENT:  History of symptomatic stenosis with right CEA by Dr. Oneida Alar in 2014.  He denise recent symptoms of stroke/TIA.   The duplex shows that B ICA < 39% stenosis.     PLAN: He will stay active and take his statin and ASA daily.  He will return in 1 year for repeat carotid duplex.  If he develops symptoms of stroke/TIA he will call 911.   Roxy Horseman PA-C [facility] Office: [phone]  MD in office Carlis Abbott

## 2020-05-16 DIAGNOSIS — C44222 Squamous cell carcinoma of skin of right ear and external auricular canal: Secondary | ICD-10-CM | POA: Diagnosis not present

## 2020-06-25 DIAGNOSIS — Z23 Encounter for immunization: Secondary | ICD-10-CM | POA: Diagnosis not present

## 2020-09-05 DIAGNOSIS — E1165 Type 2 diabetes mellitus with hyperglycemia: Secondary | ICD-10-CM | POA: Diagnosis not present

## 2020-09-05 DIAGNOSIS — E7849 Other hyperlipidemia: Secondary | ICD-10-CM | POA: Diagnosis not present

## 2020-09-05 DIAGNOSIS — I10 Essential (primary) hypertension: Secondary | ICD-10-CM | POA: Diagnosis not present

## 2020-09-11 DIAGNOSIS — D649 Anemia, unspecified: Secondary | ICD-10-CM | POA: Diagnosis not present

## 2020-09-11 DIAGNOSIS — E782 Mixed hyperlipidemia: Secondary | ICD-10-CM | POA: Diagnosis not present

## 2020-09-11 DIAGNOSIS — E7849 Other hyperlipidemia: Secondary | ICD-10-CM | POA: Diagnosis not present

## 2020-09-11 DIAGNOSIS — D513 Other dietary vitamin B12 deficiency anemia: Secondary | ICD-10-CM | POA: Diagnosis not present

## 2020-09-11 DIAGNOSIS — Z1329 Encounter for screening for other suspected endocrine disorder: Secondary | ICD-10-CM | POA: Diagnosis not present

## 2020-09-11 DIAGNOSIS — E1165 Type 2 diabetes mellitus with hyperglycemia: Secondary | ICD-10-CM | POA: Diagnosis not present

## 2020-09-11 DIAGNOSIS — K219 Gastro-esophageal reflux disease without esophagitis: Secondary | ICD-10-CM | POA: Diagnosis not present

## 2020-09-11 DIAGNOSIS — I10 Essential (primary) hypertension: Secondary | ICD-10-CM | POA: Diagnosis not present

## 2020-09-11 DIAGNOSIS — D519 Vitamin B12 deficiency anemia, unspecified: Secondary | ICD-10-CM | POA: Diagnosis not present

## 2020-09-17 DIAGNOSIS — E1165 Type 2 diabetes mellitus with hyperglycemia: Secondary | ICD-10-CM | POA: Diagnosis not present

## 2020-09-17 DIAGNOSIS — E7849 Other hyperlipidemia: Secondary | ICD-10-CM | POA: Diagnosis not present

## 2020-09-17 DIAGNOSIS — E538 Deficiency of other specified B group vitamins: Secondary | ICD-10-CM | POA: Diagnosis not present

## 2020-09-17 DIAGNOSIS — K2100 Gastro-esophageal reflux disease with esophagitis, without bleeding: Secondary | ICD-10-CM | POA: Diagnosis not present

## 2020-09-17 DIAGNOSIS — R4582 Worries: Secondary | ICD-10-CM | POA: Diagnosis not present

## 2020-09-17 DIAGNOSIS — D510 Vitamin B12 deficiency anemia due to intrinsic factor deficiency: Secondary | ICD-10-CM | POA: Diagnosis not present

## 2020-09-17 DIAGNOSIS — I10 Essential (primary) hypertension: Secondary | ICD-10-CM | POA: Diagnosis not present

## 2020-09-17 DIAGNOSIS — M810 Age-related osteoporosis without current pathological fracture: Secondary | ICD-10-CM | POA: Diagnosis not present

## 2020-09-24 DIAGNOSIS — E538 Deficiency of other specified B group vitamins: Secondary | ICD-10-CM | POA: Diagnosis not present

## 2020-10-02 DIAGNOSIS — E538 Deficiency of other specified B group vitamins: Secondary | ICD-10-CM | POA: Diagnosis not present

## 2020-10-06 DIAGNOSIS — I10 Essential (primary) hypertension: Secondary | ICD-10-CM | POA: Diagnosis not present

## 2020-10-06 DIAGNOSIS — E7849 Other hyperlipidemia: Secondary | ICD-10-CM | POA: Diagnosis not present

## 2020-10-06 DIAGNOSIS — E1165 Type 2 diabetes mellitus with hyperglycemia: Secondary | ICD-10-CM | POA: Diagnosis not present

## 2020-10-10 DIAGNOSIS — E538 Deficiency of other specified B group vitamins: Secondary | ICD-10-CM | POA: Diagnosis not present

## 2020-10-11 DIAGNOSIS — E041 Nontoxic single thyroid nodule: Secondary | ICD-10-CM | POA: Diagnosis not present

## 2020-10-11 DIAGNOSIS — Z043 Encounter for examination and observation following other accident: Secondary | ICD-10-CM | POA: Diagnosis not present

## 2020-10-11 DIAGNOSIS — S161XXA Strain of muscle, fascia and tendon at neck level, initial encounter: Secondary | ICD-10-CM | POA: Diagnosis not present

## 2020-10-11 DIAGNOSIS — I6523 Occlusion and stenosis of bilateral carotid arteries: Secondary | ICD-10-CM | POA: Diagnosis not present

## 2020-10-11 DIAGNOSIS — W091XXA Fall from playground swing, initial encounter: Secondary | ICD-10-CM | POA: Diagnosis not present

## 2020-10-11 DIAGNOSIS — M2578 Osteophyte, vertebrae: Secondary | ICD-10-CM | POA: Diagnosis not present

## 2020-10-11 DIAGNOSIS — S0003XA Contusion of scalp, initial encounter: Secondary | ICD-10-CM | POA: Diagnosis not present

## 2020-10-11 DIAGNOSIS — S0990XA Unspecified injury of head, initial encounter: Secondary | ICD-10-CM | POA: Diagnosis not present

## 2020-10-15 DIAGNOSIS — Z6824 Body mass index (BMI) 24.0-24.9, adult: Secondary | ICD-10-CM | POA: Diagnosis not present

## 2020-10-15 DIAGNOSIS — R1310 Dysphagia, unspecified: Secondary | ICD-10-CM | POA: Diagnosis not present

## 2020-10-15 DIAGNOSIS — S161XXA Strain of muscle, fascia and tendon at neck level, initial encounter: Secondary | ICD-10-CM | POA: Diagnosis not present

## 2020-10-21 DIAGNOSIS — D0472 Carcinoma in situ of skin of left lower limb, including hip: Secondary | ICD-10-CM | POA: Diagnosis not present

## 2020-10-21 DIAGNOSIS — L570 Actinic keratosis: Secondary | ICD-10-CM | POA: Diagnosis not present

## 2020-10-21 DIAGNOSIS — D485 Neoplasm of uncertain behavior of skin: Secondary | ICD-10-CM | POA: Diagnosis not present

## 2020-10-21 DIAGNOSIS — L820 Inflamed seborrheic keratosis: Secondary | ICD-10-CM | POA: Diagnosis not present

## 2020-10-31 DIAGNOSIS — C44729 Squamous cell carcinoma of skin of left lower limb, including hip: Secondary | ICD-10-CM | POA: Diagnosis not present

## 2020-11-13 DIAGNOSIS — E7849 Other hyperlipidemia: Secondary | ICD-10-CM | POA: Diagnosis not present

## 2020-11-13 DIAGNOSIS — E538 Deficiency of other specified B group vitamins: Secondary | ICD-10-CM | POA: Diagnosis not present

## 2020-11-13 DIAGNOSIS — D510 Vitamin B12 deficiency anemia due to intrinsic factor deficiency: Secondary | ICD-10-CM | POA: Diagnosis not present

## 2020-11-13 DIAGNOSIS — K2100 Gastro-esophageal reflux disease with esophagitis, without bleeding: Secondary | ICD-10-CM | POA: Diagnosis not present

## 2020-11-13 DIAGNOSIS — Z23 Encounter for immunization: Secondary | ICD-10-CM | POA: Diagnosis not present

## 2020-11-13 DIAGNOSIS — I10 Essential (primary) hypertension: Secondary | ICD-10-CM | POA: Diagnosis not present

## 2020-11-13 DIAGNOSIS — Z6824 Body mass index (BMI) 24.0-24.9, adult: Secondary | ICD-10-CM | POA: Diagnosis not present

## 2020-11-13 DIAGNOSIS — E1165 Type 2 diabetes mellitus with hyperglycemia: Secondary | ICD-10-CM | POA: Diagnosis not present

## 2020-11-26 DIAGNOSIS — Z23 Encounter for immunization: Secondary | ICD-10-CM | POA: Diagnosis not present

## 2020-11-26 DIAGNOSIS — Z20828 Contact with and (suspected) exposure to other viral communicable diseases: Secondary | ICD-10-CM | POA: Diagnosis not present

## 2020-12-04 DIAGNOSIS — Z6824 Body mass index (BMI) 24.0-24.9, adult: Secondary | ICD-10-CM | POA: Diagnosis not present

## 2020-12-04 DIAGNOSIS — S39012A Strain of muscle, fascia and tendon of lower back, initial encounter: Secondary | ICD-10-CM | POA: Diagnosis not present

## 2020-12-09 DIAGNOSIS — M9903 Segmental and somatic dysfunction of lumbar region: Secondary | ICD-10-CM | POA: Diagnosis not present

## 2020-12-09 DIAGNOSIS — M47816 Spondylosis without myelopathy or radiculopathy, lumbar region: Secondary | ICD-10-CM | POA: Diagnosis not present

## 2020-12-10 DIAGNOSIS — M47816 Spondylosis without myelopathy or radiculopathy, lumbar region: Secondary | ICD-10-CM | POA: Diagnosis not present

## 2020-12-10 DIAGNOSIS — M9903 Segmental and somatic dysfunction of lumbar region: Secondary | ICD-10-CM | POA: Diagnosis not present

## 2020-12-11 DIAGNOSIS — M47816 Spondylosis without myelopathy or radiculopathy, lumbar region: Secondary | ICD-10-CM | POA: Diagnosis not present

## 2020-12-11 DIAGNOSIS — M9903 Segmental and somatic dysfunction of lumbar region: Secondary | ICD-10-CM | POA: Diagnosis not present

## 2020-12-13 DIAGNOSIS — M47816 Spondylosis without myelopathy or radiculopathy, lumbar region: Secondary | ICD-10-CM | POA: Diagnosis not present

## 2020-12-13 DIAGNOSIS — M9903 Segmental and somatic dysfunction of lumbar region: Secondary | ICD-10-CM | POA: Diagnosis not present

## 2020-12-16 DIAGNOSIS — M47816 Spondylosis without myelopathy or radiculopathy, lumbar region: Secondary | ICD-10-CM | POA: Diagnosis not present

## 2020-12-16 DIAGNOSIS — M9903 Segmental and somatic dysfunction of lumbar region: Secondary | ICD-10-CM | POA: Diagnosis not present

## 2020-12-18 DIAGNOSIS — M47816 Spondylosis without myelopathy or radiculopathy, lumbar region: Secondary | ICD-10-CM | POA: Diagnosis not present

## 2020-12-18 DIAGNOSIS — M9903 Segmental and somatic dysfunction of lumbar region: Secondary | ICD-10-CM | POA: Diagnosis not present

## 2020-12-18 DIAGNOSIS — E538 Deficiency of other specified B group vitamins: Secondary | ICD-10-CM | POA: Diagnosis not present

## 2020-12-20 DIAGNOSIS — M47816 Spondylosis without myelopathy or radiculopathy, lumbar region: Secondary | ICD-10-CM | POA: Diagnosis not present

## 2020-12-20 DIAGNOSIS — M9903 Segmental and somatic dysfunction of lumbar region: Secondary | ICD-10-CM | POA: Diagnosis not present

## 2021-02-26 DIAGNOSIS — Z20828 Contact with and (suspected) exposure to other viral communicable diseases: Secondary | ICD-10-CM | POA: Diagnosis not present

## 2021-03-07 DIAGNOSIS — E7849 Other hyperlipidemia: Secondary | ICD-10-CM | POA: Diagnosis not present

## 2021-03-07 DIAGNOSIS — I129 Hypertensive chronic kidney disease with stage 1 through stage 4 chronic kidney disease, or unspecified chronic kidney disease: Secondary | ICD-10-CM | POA: Diagnosis not present

## 2021-03-07 DIAGNOSIS — N182 Chronic kidney disease, stage 2 (mild): Secondary | ICD-10-CM | POA: Diagnosis not present

## 2021-03-07 DIAGNOSIS — E1122 Type 2 diabetes mellitus with diabetic chronic kidney disease: Secondary | ICD-10-CM | POA: Diagnosis not present

## 2021-03-24 DIAGNOSIS — K2100 Gastro-esophageal reflux disease with esophagitis, without bleeding: Secondary | ICD-10-CM | POA: Diagnosis not present

## 2021-03-24 DIAGNOSIS — E7849 Other hyperlipidemia: Secondary | ICD-10-CM | POA: Diagnosis not present

## 2021-03-24 DIAGNOSIS — D529 Folate deficiency anemia, unspecified: Secondary | ICD-10-CM | POA: Diagnosis not present

## 2021-03-24 DIAGNOSIS — D649 Anemia, unspecified: Secondary | ICD-10-CM | POA: Diagnosis not present

## 2021-03-24 DIAGNOSIS — E782 Mixed hyperlipidemia: Secondary | ICD-10-CM | POA: Diagnosis not present

## 2021-03-24 DIAGNOSIS — I10 Essential (primary) hypertension: Secondary | ICD-10-CM | POA: Diagnosis not present

## 2021-03-24 DIAGNOSIS — R5383 Other fatigue: Secondary | ICD-10-CM | POA: Diagnosis not present

## 2021-03-24 DIAGNOSIS — E1165 Type 2 diabetes mellitus with hyperglycemia: Secondary | ICD-10-CM | POA: Diagnosis not present

## 2021-03-24 DIAGNOSIS — D519 Vitamin B12 deficiency anemia, unspecified: Secondary | ICD-10-CM | POA: Diagnosis not present

## 2021-03-24 DIAGNOSIS — Z1329 Encounter for screening for other suspected endocrine disorder: Secondary | ICD-10-CM | POA: Diagnosis not present

## 2021-03-26 DIAGNOSIS — E7849 Other hyperlipidemia: Secondary | ICD-10-CM | POA: Diagnosis not present

## 2021-03-26 DIAGNOSIS — E538 Deficiency of other specified B group vitamins: Secondary | ICD-10-CM | POA: Diagnosis not present

## 2021-03-26 DIAGNOSIS — I10 Essential (primary) hypertension: Secondary | ICD-10-CM | POA: Diagnosis not present

## 2021-03-26 DIAGNOSIS — Z0001 Encounter for general adult medical examination with abnormal findings: Secondary | ICD-10-CM | POA: Diagnosis not present

## 2021-03-26 DIAGNOSIS — D510 Vitamin B12 deficiency anemia due to intrinsic factor deficiency: Secondary | ICD-10-CM | POA: Diagnosis not present

## 2021-03-26 DIAGNOSIS — R4582 Worries: Secondary | ICD-10-CM | POA: Diagnosis not present

## 2021-03-26 DIAGNOSIS — M810 Age-related osteoporosis without current pathological fracture: Secondary | ICD-10-CM | POA: Diagnosis not present

## 2021-03-26 DIAGNOSIS — E1165 Type 2 diabetes mellitus with hyperglycemia: Secondary | ICD-10-CM | POA: Diagnosis not present

## 2021-04-21 DIAGNOSIS — L570 Actinic keratosis: Secondary | ICD-10-CM | POA: Diagnosis not present

## 2021-04-21 DIAGNOSIS — D0472 Carcinoma in situ of skin of left lower limb, including hip: Secondary | ICD-10-CM | POA: Diagnosis not present

## 2021-04-21 DIAGNOSIS — C44729 Squamous cell carcinoma of skin of left lower limb, including hip: Secondary | ICD-10-CM | POA: Diagnosis not present

## 2021-05-03 ENCOUNTER — Other Ambulatory Visit: Payer: Self-pay

## 2021-05-03 DIAGNOSIS — I6523 Occlusion and stenosis of bilateral carotid arteries: Secondary | ICD-10-CM

## 2021-05-09 ENCOUNTER — Ambulatory Visit (INDEPENDENT_AMBULATORY_CARE_PROVIDER_SITE_OTHER): Payer: Medicare Other | Admitting: Physician Assistant

## 2021-05-09 ENCOUNTER — Other Ambulatory Visit: Payer: Self-pay

## 2021-05-09 ENCOUNTER — Ambulatory Visit (HOSPITAL_COMMUNITY)
Admission: RE | Admit: 2021-05-09 | Discharge: 2021-05-09 | Disposition: A | Discharge status: Home / Self Care | Payer: Medicare Other | Source: Ambulatory Visit | Attending: Vascular Surgery | Admitting: Vascular Surgery

## 2021-05-09 VITALS — BP 122/58 | HR 55 | Temp 97.7°F | Resp 20 | Ht 68.0 in | Wt 166.7 lb

## 2021-05-09 DIAGNOSIS — I6523 Occlusion and stenosis of bilateral carotid arteries: Secondary | ICD-10-CM | POA: Diagnosis not present

## 2021-05-09 NOTE — Progress Notes (Signed)
?Office Note  ? ? ? ?CC:  follow up ?Requesting Provider:  Caryl Bis, MD ? ?HPI: Terrance Brown is a 81 y.o. (January 12, 1941) male who presents for routine surveillance follow up of carotid artery stenosis. He has remote history of right CEA in 2014 by Dr. Oneida Alar.  He has had no TIA or stroke symptoms. He denies any amaurosis or other visual changes, no slurred speech, facial drooping, unilateral upper or lower extremity weakness or numbness.He denies any claudication symptoms, rest pain or non healing wounds. He says he remains very active. 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  ? ?The pt is on a statin for cholesterol management.  ?The pt is not on a daily aspirin.   Other AC:  none ?The pt is on ACEI/HCTZ for hypertension.   ?The pt is not diabetic.   ?Tobacco hx:  Former ? ?Past Medical History:  ?Diagnosis Date  ? Arthritis   ? Cancer [facility])   ? skin CA removed from neck,ear, back, hand  ? Carotid artery occlusion   ? Diabetes mellitus without complication ([place])   ? Erectile dysfunction   ? HOH (hard of hearing)   ? Hyperlipidemia   ? Hypertension   ? Insomnia   ? ? ?Past Surgical History:  ?Procedure Laterality Date  ? COLONOSCOPY    ? ENDARTERECTOMY Right 01/30/2013  ? Procedure: ENDARTERECTOMY CAROTID-RIGHT;  Surgeon: Elam Dutch, MD;  Location: [facility] OR;  Service: Vascular;  Laterality: Right;  ? HEMORRHOID SURGERY    ? PATCH ANGIOPLASTY Right 01/30/2013  ? Procedure: RIGHT CAROTID ARTERY PATCH ANGIOPLASTY USING HEMASHIELD PATCH;  Surgeon: Elam Dutch, MD;  Location: [facility];  Service: Vascular;  Laterality: Right;  ? TONSILLECTOMY    ? as a child  ? ? ?Social History  ? ?Socioeconomic History  ? Marital status: Married  ?  Spouse name: Not on file  ? Number of children: Not on file  ? Years of education: Not on file  ? Highest education level: Not on file  ?Occupational History  ? Not on file  ?Tobacco Use  ? Smoking status: Former  ?  Types: Cigarettes  ?  Quit  date: 01/19/1993  ?  Years since quitting: 28.3  ?  Passive exposure: Never  ? Smokeless tobacco: Never  ?Substance and Sexual Activity  ? Alcohol use: Yes  ?  Alcohol/week: 16.0 - 20.0 standard drinks  ?  Types: 2 - 3 Glasses of wine, 2 - 3 Cans of beer, 12 - 14 Shots of liquor per week  ?  Comment: weekly  ? Drug use: No  ? Sexual activity: Not on file  ?Other Topics Concern  ? Not on file  ?Social History Narrative  ? Not on file  ? ?Social Determinants of Health  ? ?Financial Resource Strain: Not on file  ?Food Insecurity: Not on file  ?Transportation Needs: Not on file  ?Physical Activity: Not on file  ?Stress: Not on file  ?Social Connections: Not on file  ?Intimate Partner Violence: Not on file  ? ? ?Family History  ?Problem Relation Age of Onset  ? Diabetes Father   ? Heart disease Father   ? Hyperlipidemia Father   ? Heart attack Father   ? Hypertension Father   ? ? ?Current Outpatient Medications  ?Medication Sig Dispense Refill  ? alendronate (FOSAMAX) 70 MG tablet Take 70 mg by mouth once a week. Take with a full  glass of water on an empty stomach on Fridays    ? cholecalciferol (VITAMIN D) 1000 UNITS tablet Take 1,000 Units by mouth daily. Reported on 09/26/2015    ? lisinopril-hydrochlorothiazide (PRINZIDE,ZESTORETIC) 10-12.5 MG per tablet Take 1 tablet by mouth daily.    ? lovastatin (MEVACOR) 20 MG tablet Take 40 mg by mouth daily after supper.    ? Multiple Vitamin (MULTIVITAMIN WITH MINERALS) TABS tablet Take 1 tablet by mouth daily.    ? naproxen sodium (ALEVE) 220 MG tablet Take 220 mg by mouth daily.    ? oxyCODONE-acetaminophen (ROXICET) 5-325 MG per tablet Take 1-2 tablets by mouth every 4 (four) hours as needed for severe pain. 30 tablet 0  ? sildenafil (VIAGRA) 100 MG tablet Take 100 mg by mouth daily as needed for erectile dysfunction.    ? zolpidem (AMBIEN) 10 MG tablet Take 10 mg by mouth at bedtime.     ? aspirin EC 81 MG tablet Take 81 mg by mouth daily. (Patient not taking: Reported on  05/09/2021)    ? atorvastatin (LIPITOR) 20 MG tablet Take 20 mg by mouth daily. (Patient not taking: Reported on 05/09/2021)    ? ?No current facility-administered medications for this visit.  ? ? ?Allergies  ?Allergen Reactions  ? Bupropion Palpitations  ? ? ? ?REVIEW OF SYSTEMS:  ? ?[X]  denotes positive finding, [ ]  denotes negative finding ?Cardiac  Comments:  ?Chest pain or chest pressure:    ?Shortness of breath upon exertion:    ?Short of breath when lying flat:    ?Irregular heart rhythm:    ?    ?Vascular    ?Pain in calf, thigh, or hip brought on by ambulation:    ?Pain in feet at night that wakes you up from your sleep:     ?Blood clot in your veins:    ?Leg swelling:     ?    ?Pulmonary    ?Oxygen at home:    ?Productive cough:     ?Wheezing:     ?    ?Neurologic    ?Sudden weakness in arms or legs:     ?Sudden numbness in arms or legs:     ?Sudden onset of difficulty speaking or slurred speech:    ?Temporary loss of vision in one eye:     ?Problems with dizziness:     ?    ?Gastrointestinal    ?Blood in stool:     ?Vomited blood:     ?    ?Genitourinary    ?Burning when urinating:     ?Blood in urine:    ?    ?Psychiatric    ?Major depression:     ?    ?Hematologic    ?Bleeding problems:    ?Problems with blood clotting too easily:    ?    ?Skin    ?Rashes or ulcers:    ?    ?Constitutional    ?Fever or chills:    ? ? ?PHYSICAL EXAMINATION: ? ?Vitals:  ? 05/09/21 1017 05/09/21 1019  ?BP: 134/64 (!) 122/58  ?Pulse: (!) 55   ?Resp: 20   ?Temp: 97.7 ?F (36.5 ?C)   ?TempSrc: Temporal   ?SpO2: 100%   ?Weight: 166 lb 11.2 oz (75.6 kg)   ?Height: 5\' 8"  (1.727 m)   ? ? ?General:  WDWN in NAD; vital signs documented above ?Gait: Normal ?HENT: WNL, normocephalic ?Pulmonary: normal non-labored breathing without wheezing ?Cardiac: regular HR, without  Murmurs without  carotid bruit ?Abdomen: soft, NT, no masses ?Vascular Exam/Pulses:2+ radial pulses bilaterally, 2+ Dp pulses bilaterally ?Extremities: without ischemic  changes, without Gangrene , without cellulitis; without open wounds;  ?Musculoskeletal: no muscle wasting or atrophy  ?Neurologic: A&O X 3;  No focal weakness or paresthesias are detected ?Psychiatric:  The pt has Normal affect. ? ? ?Non-Invasive Vascular Imaging:   ?VAS Carotid Duplex Bilaterally: ?Summary:  ?Right Carotid: Velocities in the right ICA are consistent with a 1-39% stenosis. Non-hemodynamically significant plaque <50% noted in the CCA.  ? ?Left Carotid: Velocities in the left ICA are consistent with a 1-39% stenosis. Non-hemodynamically significant plaque <50% noted in the CCA.  ? ?Vertebrals:  Bilateral vertebral arteries demonstrate antegrade flow.  ?Subclavians: Normal flow hemodynamics were seen in bilateral subclavian arteries.  ? ? ?ASSESSMENT/PLAN:: 81 y.o. male here for follow up for carotid artery stenosis. Has remote history of right CEA in 2014 by Dr. Oneida Alar. He has not had any TIA or stroke like symptoms. He remains without any associated neurological symptoms. His duplex today shows 1-39% ICA stenosis bilaterally, normal flow in vertebral and subclavian arteries bilaterally. ?- continue statin ?- Reviewed signs and symptoms of TIA/ Stroke and he understands should this occur to seek immediate medical attention ?-he will follow up in 1 year with repeat carotid duplex ? ? ?Karoline Caldwell, PA-C ?[facility] ?[phone] ? ?Clinic MD:   Dr. Virl Cagey ?

## 2021-07-18 DIAGNOSIS — D529 Folate deficiency anemia, unspecified: Secondary | ICD-10-CM | POA: Diagnosis not present

## 2021-07-18 DIAGNOSIS — K219 Gastro-esophageal reflux disease without esophagitis: Secondary | ICD-10-CM | POA: Diagnosis not present

## 2021-07-18 DIAGNOSIS — D519 Vitamin B12 deficiency anemia, unspecified: Secondary | ICD-10-CM | POA: Diagnosis not present

## 2021-07-18 DIAGNOSIS — E1165 Type 2 diabetes mellitus with hyperglycemia: Secondary | ICD-10-CM | POA: Diagnosis not present

## 2021-07-18 DIAGNOSIS — E782 Mixed hyperlipidemia: Secondary | ICD-10-CM | POA: Diagnosis not present

## 2021-07-18 DIAGNOSIS — I10 Essential (primary) hypertension: Secondary | ICD-10-CM | POA: Diagnosis not present

## 2021-07-18 DIAGNOSIS — D649 Anemia, unspecified: Secondary | ICD-10-CM | POA: Diagnosis not present

## 2021-07-18 DIAGNOSIS — E7849 Other hyperlipidemia: Secondary | ICD-10-CM | POA: Diagnosis not present

## 2021-07-24 DIAGNOSIS — K2100 Gastro-esophageal reflux disease with esophagitis, without bleeding: Secondary | ICD-10-CM | POA: Diagnosis not present

## 2021-07-24 DIAGNOSIS — I10 Essential (primary) hypertension: Secondary | ICD-10-CM | POA: Diagnosis not present

## 2021-07-24 DIAGNOSIS — R4582 Worries: Secondary | ICD-10-CM | POA: Diagnosis not present

## 2021-07-24 DIAGNOSIS — E7849 Other hyperlipidemia: Secondary | ICD-10-CM | POA: Diagnosis not present

## 2021-07-24 DIAGNOSIS — E1165 Type 2 diabetes mellitus with hyperglycemia: Secondary | ICD-10-CM | POA: Diagnosis not present

## 2021-07-24 DIAGNOSIS — Z6824 Body mass index (BMI) 24.0-24.9, adult: Secondary | ICD-10-CM | POA: Diagnosis not present

## 2021-07-24 DIAGNOSIS — D510 Vitamin B12 deficiency anemia due to intrinsic factor deficiency: Secondary | ICD-10-CM | POA: Diagnosis not present

## 2021-07-24 DIAGNOSIS — E538 Deficiency of other specified B group vitamins: Secondary | ICD-10-CM | POA: Diagnosis not present

## 2021-08-12 DIAGNOSIS — Z23 Encounter for immunization: Secondary | ICD-10-CM | POA: Diagnosis not present

## 2021-10-22 DIAGNOSIS — L570 Actinic keratosis: Secondary | ICD-10-CM | POA: Diagnosis not present

## 2021-11-12 DIAGNOSIS — E039 Hypothyroidism, unspecified: Secondary | ICD-10-CM | POA: Diagnosis not present

## 2021-11-12 DIAGNOSIS — K219 Gastro-esophageal reflux disease without esophagitis: Secondary | ICD-10-CM | POA: Diagnosis not present

## 2021-11-12 DIAGNOSIS — R739 Hyperglycemia, unspecified: Secondary | ICD-10-CM | POA: Diagnosis not present

## 2021-11-12 DIAGNOSIS — I10 Essential (primary) hypertension: Secondary | ICD-10-CM | POA: Diagnosis not present

## 2021-11-12 DIAGNOSIS — E7849 Other hyperlipidemia: Secondary | ICD-10-CM | POA: Diagnosis not present

## 2021-11-18 DIAGNOSIS — Z23 Encounter for immunization: Secondary | ICD-10-CM | POA: Diagnosis not present

## 2021-11-18 DIAGNOSIS — Z6824 Body mass index (BMI) 24.0-24.9, adult: Secondary | ICD-10-CM | POA: Diagnosis not present

## 2021-11-18 DIAGNOSIS — K2100 Gastro-esophageal reflux disease with esophagitis, without bleeding: Secondary | ICD-10-CM | POA: Diagnosis not present

## 2021-11-18 DIAGNOSIS — R4582 Worries: Secondary | ICD-10-CM | POA: Diagnosis not present

## 2021-11-18 DIAGNOSIS — I10 Essential (primary) hypertension: Secondary | ICD-10-CM | POA: Diagnosis not present

## 2021-11-18 DIAGNOSIS — M810 Age-related osteoporosis without current pathological fracture: Secondary | ICD-10-CM | POA: Diagnosis not present

## 2021-11-18 DIAGNOSIS — E1165 Type 2 diabetes mellitus with hyperglycemia: Secondary | ICD-10-CM | POA: Diagnosis not present

## 2021-11-18 DIAGNOSIS — D510 Vitamin B12 deficiency anemia due to intrinsic factor deficiency: Secondary | ICD-10-CM | POA: Diagnosis not present

## 2021-11-18 DIAGNOSIS — E7849 Other hyperlipidemia: Secondary | ICD-10-CM | POA: Diagnosis not present

## 2021-11-18 DIAGNOSIS — E538 Deficiency of other specified B group vitamins: Secondary | ICD-10-CM | POA: Diagnosis not present

## 2021-12-17 DIAGNOSIS — M9903 Segmental and somatic dysfunction of lumbar region: Secondary | ICD-10-CM | POA: Diagnosis not present

## 2021-12-17 DIAGNOSIS — M47816 Spondylosis without myelopathy or radiculopathy, lumbar region: Secondary | ICD-10-CM | POA: Diagnosis not present

## 2021-12-23 DIAGNOSIS — M9903 Segmental and somatic dysfunction of lumbar region: Secondary | ICD-10-CM | POA: Diagnosis not present

## 2021-12-23 DIAGNOSIS — M47816 Spondylosis without myelopathy or radiculopathy, lumbar region: Secondary | ICD-10-CM | POA: Diagnosis not present

## 2022-01-06 DIAGNOSIS — Z23 Encounter for immunization: Secondary | ICD-10-CM | POA: Diagnosis not present

## 2022-01-28 DIAGNOSIS — Z20822 Contact with and (suspected) exposure to covid-19: Secondary | ICD-10-CM | POA: Diagnosis not present

## 2022-01-30 DIAGNOSIS — Z20828 Contact with and (suspected) exposure to other viral communicable diseases: Secondary | ICD-10-CM | POA: Diagnosis not present

## 2022-01-30 DIAGNOSIS — K649 Unspecified hemorrhoids: Secondary | ICD-10-CM | POA: Diagnosis not present

## 2022-01-30 DIAGNOSIS — R059 Cough, unspecified: Secondary | ICD-10-CM | POA: Diagnosis not present

## 2022-01-30 DIAGNOSIS — R030 Elevated blood-pressure reading, without diagnosis of hypertension: Secondary | ICD-10-CM | POA: Diagnosis not present

## 2022-01-30 DIAGNOSIS — Z6824 Body mass index (BMI) 24.0-24.9, adult: Secondary | ICD-10-CM | POA: Diagnosis not present

## 2022-03-23 DIAGNOSIS — D649 Anemia, unspecified: Secondary | ICD-10-CM | POA: Diagnosis not present

## 2022-03-23 DIAGNOSIS — E039 Hypothyroidism, unspecified: Secondary | ICD-10-CM | POA: Diagnosis not present

## 2022-03-23 DIAGNOSIS — K219 Gastro-esophageal reflux disease without esophagitis: Secondary | ICD-10-CM | POA: Diagnosis not present

## 2022-03-23 DIAGNOSIS — E7849 Other hyperlipidemia: Secondary | ICD-10-CM | POA: Diagnosis not present

## 2022-03-23 DIAGNOSIS — Z0001 Encounter for general adult medical examination with abnormal findings: Secondary | ICD-10-CM | POA: Diagnosis not present

## 2022-03-23 DIAGNOSIS — E1165 Type 2 diabetes mellitus with hyperglycemia: Secondary | ICD-10-CM | POA: Diagnosis not present

## 2022-03-27 DIAGNOSIS — E1165 Type 2 diabetes mellitus with hyperglycemia: Secondary | ICD-10-CM | POA: Diagnosis not present

## 2022-03-27 DIAGNOSIS — E7849 Other hyperlipidemia: Secondary | ICD-10-CM | POA: Diagnosis not present

## 2022-03-27 DIAGNOSIS — D510 Vitamin B12 deficiency anemia due to intrinsic factor deficiency: Secondary | ICD-10-CM | POA: Diagnosis not present

## 2022-03-27 DIAGNOSIS — R4582 Worries: Secondary | ICD-10-CM | POA: Diagnosis not present

## 2022-03-27 DIAGNOSIS — K2100 Gastro-esophageal reflux disease with esophagitis, without bleeding: Secondary | ICD-10-CM | POA: Diagnosis not present

## 2022-03-27 DIAGNOSIS — M810 Age-related osteoporosis without current pathological fracture: Secondary | ICD-10-CM | POA: Diagnosis not present

## 2022-03-27 DIAGNOSIS — E538 Deficiency of other specified B group vitamins: Secondary | ICD-10-CM | POA: Diagnosis not present

## 2022-03-27 DIAGNOSIS — Z6825 Body mass index (BMI) 25.0-25.9, adult: Secondary | ICD-10-CM | POA: Diagnosis not present

## 2022-03-27 DIAGNOSIS — I10 Essential (primary) hypertension: Secondary | ICD-10-CM | POA: Diagnosis not present

## 2022-03-27 DIAGNOSIS — Z23 Encounter for immunization: Secondary | ICD-10-CM | POA: Diagnosis not present

## 2022-03-27 DIAGNOSIS — Z0001 Encounter for general adult medical examination with abnormal findings: Secondary | ICD-10-CM | POA: Diagnosis not present

## 2022-04-28 DIAGNOSIS — L820 Inflamed seborrheic keratosis: Secondary | ICD-10-CM | POA: Diagnosis not present

## 2022-04-28 DIAGNOSIS — C44712 Basal cell carcinoma of skin of right lower limb, including hip: Secondary | ICD-10-CM | POA: Diagnosis not present

## 2022-04-28 DIAGNOSIS — L570 Actinic keratosis: Secondary | ICD-10-CM | POA: Diagnosis not present

## 2022-04-28 DIAGNOSIS — D485 Neoplasm of uncertain behavior of skin: Secondary | ICD-10-CM | POA: Diagnosis not present

## 2022-05-07 DIAGNOSIS — C44722 Squamous cell carcinoma of skin of right lower limb, including hip: Secondary | ICD-10-CM | POA: Diagnosis not present

## 2022-05-25 ENCOUNTER — Other Ambulatory Visit: Payer: Self-pay | Admitting: *Deleted

## 2022-05-25 DIAGNOSIS — I6523 Occlusion and stenosis of bilateral carotid arteries: Secondary | ICD-10-CM

## 2022-06-09 NOTE — Progress Notes (Unsigned)
HISTORY AND PHYSICAL     CC:  follow up. Requesting Provider:  Caryl Bis, MD  HPI: This is a 82 y.o. male here for follow up for carotid artery stenosis.  Pt is s/p right CEA for asymptomatic carotid artery stenosis on 01/30/2013 by Dr. Oneida Alar.    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    Pt returns today for follow up.    Pt denies any amaurosis fugax, speech difficulties, weakness, numbness, paralysis or clumsiness or facial droop.   He states he occasionally has trouble understanding others due to his hearing aides.   He denies any trouble with his legs or feet.  He has stopped taking his baby asa as his PCP felt he did not need this.  He states he has a hernia.    The pt is on a statin for cholesterol management.  The pt is on a daily aspirin.   Other AC:  none The pt is on ACEI, diuretic for hypertension.   The pt is not on medication for diabetes Tobacco hx:  former  Pt does not have family hx of AAA.  Past Medical History:  Diagnosis Date   Arthritis    Cancer ([place])    skin CA removed from neck,ear, back, hand   Carotid artery occlusion    Diabetes mellitus without complication ([facility])    Erectile dysfunction    HOH (hard of hearing)    Hyperlipidemia    Hypertension    Insomnia     Past Surgical History:  Procedure Laterality Date   COLONOSCOPY     ENDARTERECTOMY Right 01/30/2013   Procedure: ENDARTERECTOMY CAROTID-RIGHT;  Surgeon: Elam Dutch, MD;  Location: [facility];  Service: Vascular;  Laterality: Right;   [place] Right 01/30/2013   Procedure: RIGHT CAROTID ARTERY PATCH ANGIOPLASTY USING HEMASHIELD PATCH;  Surgeon: Elam Dutch, MD;  Location: [place];  Service: Vascular;  Laterality: Right;   TONSILLECTOMY     as a child    Allergies  Allergen Reactions    Bupropion Palpitations    Current Outpatient Medications  Medication Sig Dispense Refill   alendronate (FOSAMAX) 70 MG tablet Take 70 mg by mouth once a week. Take with a full glass of water on an empty stomach on Fridays     aspirin EC 81 MG tablet Take 81 mg by mouth daily. (Patient not taking: Reported on 05/09/2021)     atorvastatin (LIPITOR) 20 MG tablet Take 20 mg by mouth daily. (Patient not taking: Reported on 05/09/2021)     cholecalciferol (VITAMIN D) 1000 UNITS tablet Take 1,000 Units by mouth daily. Reported on 09/26/2015     lisinopril-hydrochlorothiazide (PRINZIDE,ZESTORETIC) 10-12.5 MG per tablet Take 1 tablet by mouth daily.     lovastatin (MEVACOR) 20 MG tablet Take 40 mg by mouth daily after supper.     Multiple Vitamin (MULTIVITAMIN WITH MINERALS) TABS tablet Take 1 tablet by mouth daily.     naproxen sodium (ALEVE) 220 MG tablet Take 220 mg by mouth daily.     oxyCODONE-acetaminophen (ROXICET) 5-325 MG per tablet Take 1-2 tablets by mouth every 4 (four) hours as needed for severe pain. 30 tablet 0   sildenafil (VIAGRA) 100 MG tablet Take 100 mg by mouth daily as needed for erectile dysfunction.     zolpidem (  AMBIEN) 10 MG tablet Take 10 mg by mouth at bedtime.      No current facility-administered medications for this visit.    Family History  Problem Relation Age of Onset   Diabetes Father    Heart disease Father    Hyperlipidemia Father    Heart attack Father    Hypertension Father     Social History   Socioeconomic History   Marital status: Married    Spouse name: Not on file   Number of children: Not on file   Years of education: Not on file   Highest education level: Not on file  Occupational History   Not on file  Tobacco Use   Smoking status: Former    Types: Cigarettes    Quit date: 01/19/1993    Years since quitting: 29.4    Passive exposure: Never   Smokeless tobacco: Never  Substance and Sexual Activity   Alcohol use: Yes    Alcohol/week: 16.0  - 20.0 standard drinks of alcohol    Types: 2 - 3 Glasses of wine, 2 - 3 Cans of beer, 12 - 14 Shots of liquor per week    Comment: weekly   Drug use: No   Sexual activity: Not on file  Other Topics Concern   Not on file  Social History Narrative   Not on file   Social Determinants of Health   Financial Resource Strain: Not on file  Food Insecurity: Not on file  Transportation Needs: Not on file  Physical Activity: Not on file  Stress: Not on file  Social Connections: Not on file  Intimate Partner Violence: Not on file     REVIEW OF SYSTEMS:   [X]  denotes positive finding, [ ]  denotes negative finding Cardiac  Comments:  Chest pain or chest pressure:    Shortness of breath upon exertion:    Short of breath when lying flat:    Irregular heart rhythm:        Vascular    Pain in calf, thigh, or hip brought on by ambulation:    Pain in feet at night that wakes you up from your sleep:     Blood clot in your veins:    Leg swelling:         Pulmonary    Oxygen at home:    Productive cough:     Wheezing:         Neurologic    Sudden weakness in arms or legs:     Sudden numbness in arms or legs:     Sudden onset of difficulty speaking or slurred speech:    Temporary loss of vision in one eye:     Problems with dizziness:         Gastrointestinal    Blood in stool:     Vomited blood:         Genitourinary    Burning when urinating:     Blood in urine:        Psychiatric    Major depression:         Hematologic    Bleeding problems:    Problems with blood clotting too easily:        Skin    Rashes or ulcers:        Constitutional    Fever or chills:      PHYSICAL EXAMINATION:  Today's Vitals   06/10/22 1246 06/10/22 1247  BP: 121/68 109/67  Pulse: (!) 54   Resp: 20  Temp: 98 F (36.7 C)   TempSrc: Temporal   SpO2: 97%   Weight: 165 lb 12.8 oz (75.2 kg)   Height: 5\' 8"  (1.727 m)    Body mass index is 25.21 kg/m.   General:  WDWN in NAD;  vital signs documented above Gait: Not observed HENT: WNL, normocephalic Pulmonary: normal non-labored breathing Cardiac: regular HR, without carotid bruits Abdomen: soft, NT; aortic pulse is not palpable Skin: without rashes; well healed neck scar Vascular Exam/Pulses:  Right Left  Radial 2+ (normal) 2+ (normal)  AT 2+ (normal) 2+ (normal)  PT 2+ (normal) 2+ (normal)   Extremities: without open wounds Musculoskeletal: no muscle wasting or atrophy  Neurologic: A&O X 3; moving all extremities equally; speech is fluent/normal Psychiatric:  The pt has Normal affect.   Non-Invasive Vascular Imaging:   Carotid Duplex on 06/10/2022 Right:  1-39% ICA stenosis Left:  1-39% ICA stenosis Vertebrals: Bilateral vertebral arteries demonstrate antegrade flow   Previous Carotid duplex on 05/09/2021: Right: 1-39% ICA stenosis Left:   1-39% ICA stenosis    ASSESSMENT/PLAN:: 82 y.o. male here for follow up carotid artery stenosis and has remote history of right CEA in 2014 by Dr. Oneida Alar.   -duplex today reveals bilateral 1-39% ICA stenosis.  -discussed s/s of stroke with pt and he understands should he develop any of these sx, he will go to the nearest ER or call 911. -pt will f/u in one year with carotid duplex -pt will call sooner should he have any issues. -continue statin.  He has not been taking asa as PCP felt he could stop this.  I discussed with him that if he can tolerate a baby asa, we prefer that he be on it given his hx of CEA.     Leontine Locket, [facility] [phone]  Clinic MD:  Donzetta Matters

## 2022-06-10 ENCOUNTER — Ambulatory Visit (HOSPITAL_COMMUNITY)
Admission: RE | Admit: 2022-06-10 | Discharge: 2022-06-10 | Disposition: A | Discharge status: Home / Self Care | Payer: Medicare Other | Source: Ambulatory Visit | Attending: Vascular Surgery | Admitting: Vascular Surgery

## 2022-06-10 ENCOUNTER — Ambulatory Visit (INDEPENDENT_AMBULATORY_CARE_PROVIDER_SITE_OTHER): Payer: Medicare Other | Admitting: Physician Assistant

## 2022-06-10 ENCOUNTER — Encounter: Payer: Self-pay | Admitting: Physician Assistant

## 2022-06-10 VITALS — BP 109/67 | HR 54 | Temp 98.0°F | Resp 20 | Ht 68.0 in | Wt 165.8 lb

## 2022-06-10 DIAGNOSIS — I6523 Occlusion and stenosis of bilateral carotid arteries: Secondary | ICD-10-CM | POA: Insufficient documentation

## 2022-07-01 DIAGNOSIS — D519 Vitamin B12 deficiency anemia, unspecified: Secondary | ICD-10-CM | POA: Diagnosis not present

## 2022-07-01 DIAGNOSIS — K2100 Gastro-esophageal reflux disease with esophagitis, without bleeding: Secondary | ICD-10-CM | POA: Diagnosis not present

## 2022-07-01 DIAGNOSIS — D529 Folate deficiency anemia, unspecified: Secondary | ICD-10-CM | POA: Diagnosis not present

## 2022-07-01 DIAGNOSIS — E7849 Other hyperlipidemia: Secondary | ICD-10-CM | POA: Diagnosis not present

## 2022-07-01 DIAGNOSIS — D649 Anemia, unspecified: Secondary | ICD-10-CM | POA: Diagnosis not present

## 2022-07-01 DIAGNOSIS — E1165 Type 2 diabetes mellitus with hyperglycemia: Secondary | ICD-10-CM | POA: Diagnosis not present

## 2022-07-01 DIAGNOSIS — R5383 Other fatigue: Secondary | ICD-10-CM | POA: Diagnosis not present

## 2022-07-08 DIAGNOSIS — I10 Essential (primary) hypertension: Secondary | ICD-10-CM | POA: Diagnosis not present

## 2022-07-08 DIAGNOSIS — D510 Vitamin B12 deficiency anemia due to intrinsic factor deficiency: Secondary | ICD-10-CM | POA: Diagnosis not present

## 2022-07-08 DIAGNOSIS — E538 Deficiency of other specified B group vitamins: Secondary | ICD-10-CM | POA: Diagnosis not present

## 2022-07-08 DIAGNOSIS — K2100 Gastro-esophageal reflux disease with esophagitis, without bleeding: Secondary | ICD-10-CM | POA: Diagnosis not present

## 2022-07-08 DIAGNOSIS — Z6824 Body mass index (BMI) 24.0-24.9, adult: Secondary | ICD-10-CM | POA: Diagnosis not present

## 2022-07-08 DIAGNOSIS — M810 Age-related osteoporosis without current pathological fracture: Secondary | ICD-10-CM | POA: Diagnosis not present

## 2022-07-08 DIAGNOSIS — E1165 Type 2 diabetes mellitus with hyperglycemia: Secondary | ICD-10-CM | POA: Diagnosis not present

## 2022-07-08 DIAGNOSIS — R4582 Worries: Secondary | ICD-10-CM | POA: Diagnosis not present

## 2022-07-08 DIAGNOSIS — E7849 Other hyperlipidemia: Secondary | ICD-10-CM | POA: Diagnosis not present

## 2022-10-03 DIAGNOSIS — R030 Elevated blood-pressure reading, without diagnosis of hypertension: Secondary | ICD-10-CM | POA: Diagnosis not present

## 2022-10-03 DIAGNOSIS — S30862A Insect bite (nonvenomous) of penis, initial encounter: Secondary | ICD-10-CM | POA: Diagnosis not present

## 2022-10-03 DIAGNOSIS — Z6825 Body mass index (BMI) 25.0-25.9, adult: Secondary | ICD-10-CM | POA: Diagnosis not present

## 2022-10-14 DIAGNOSIS — L570 Actinic keratosis: Secondary | ICD-10-CM | POA: Diagnosis not present

## 2022-11-27 DIAGNOSIS — Z23 Encounter for immunization: Secondary | ICD-10-CM | POA: Diagnosis not present

## 2022-12-08 DIAGNOSIS — Z20828 Contact with and (suspected) exposure to other viral communicable diseases: Secondary | ICD-10-CM | POA: Diagnosis not present

## 2022-12-08 DIAGNOSIS — Z6824 Body mass index (BMI) 24.0-24.9, adult: Secondary | ICD-10-CM | POA: Diagnosis not present

## 2022-12-08 DIAGNOSIS — R059 Cough, unspecified: Secondary | ICD-10-CM | POA: Diagnosis not present

## 2022-12-08 DIAGNOSIS — R509 Fever, unspecified: Secondary | ICD-10-CM | POA: Diagnosis not present

## 2022-12-08 DIAGNOSIS — R030 Elevated blood-pressure reading, without diagnosis of hypertension: Secondary | ICD-10-CM | POA: Diagnosis not present

## 2022-12-16 DIAGNOSIS — R059 Cough, unspecified: Secondary | ICD-10-CM | POA: Diagnosis not present

## 2022-12-16 DIAGNOSIS — R509 Fever, unspecified: Secondary | ICD-10-CM | POA: Diagnosis not present

## 2022-12-16 DIAGNOSIS — Z20822 Contact with and (suspected) exposure to covid-19: Secondary | ICD-10-CM | POA: Diagnosis not present

## 2022-12-16 DIAGNOSIS — Z6824 Body mass index (BMI) 24.0-24.9, adult: Secondary | ICD-10-CM | POA: Diagnosis not present

## 2022-12-16 DIAGNOSIS — R030 Elevated blood-pressure reading, without diagnosis of hypertension: Secondary | ICD-10-CM | POA: Diagnosis not present

## 2022-12-18 DIAGNOSIS — Z23 Encounter for immunization: Secondary | ICD-10-CM | POA: Diagnosis not present

## 2022-12-30 DIAGNOSIS — Z0001 Encounter for general adult medical examination with abnormal findings: Secondary | ICD-10-CM | POA: Diagnosis not present

## 2022-12-30 DIAGNOSIS — I10 Essential (primary) hypertension: Secondary | ICD-10-CM | POA: Diagnosis not present

## 2022-12-30 DIAGNOSIS — E1165 Type 2 diabetes mellitus with hyperglycemia: Secondary | ICD-10-CM | POA: Diagnosis not present

## 2022-12-30 DIAGNOSIS — Z1329 Encounter for screening for other suspected endocrine disorder: Secondary | ICD-10-CM | POA: Diagnosis not present

## 2022-12-30 DIAGNOSIS — E7849 Other hyperlipidemia: Secondary | ICD-10-CM | POA: Diagnosis not present

## 2022-12-30 DIAGNOSIS — E782 Mixed hyperlipidemia: Secondary | ICD-10-CM | POA: Diagnosis not present

## 2022-12-31 DIAGNOSIS — E782 Mixed hyperlipidemia: Secondary | ICD-10-CM | POA: Diagnosis not present

## 2022-12-31 DIAGNOSIS — K2100 Gastro-esophageal reflux disease with esophagitis, without bleeding: Secondary | ICD-10-CM | POA: Diagnosis not present

## 2023-01-01 DIAGNOSIS — M9903 Segmental and somatic dysfunction of lumbar region: Secondary | ICD-10-CM | POA: Diagnosis not present

## 2023-01-01 DIAGNOSIS — M47816 Spondylosis without myelopathy or radiculopathy, lumbar region: Secondary | ICD-10-CM | POA: Diagnosis not present

## 2023-01-04 DIAGNOSIS — M9903 Segmental and somatic dysfunction of lumbar region: Secondary | ICD-10-CM | POA: Diagnosis not present

## 2023-01-04 DIAGNOSIS — M47816 Spondylosis without myelopathy or radiculopathy, lumbar region: Secondary | ICD-10-CM | POA: Diagnosis not present

## 2023-01-05 DIAGNOSIS — K2100 Gastro-esophageal reflux disease with esophagitis, without bleeding: Secondary | ICD-10-CM | POA: Diagnosis not present

## 2023-01-05 DIAGNOSIS — E538 Deficiency of other specified B group vitamins: Secondary | ICD-10-CM | POA: Diagnosis not present

## 2023-01-05 DIAGNOSIS — R4582 Worries: Secondary | ICD-10-CM | POA: Diagnosis not present

## 2023-01-05 DIAGNOSIS — Z6824 Body mass index (BMI) 24.0-24.9, adult: Secondary | ICD-10-CM | POA: Diagnosis not present

## 2023-01-05 DIAGNOSIS — E1165 Type 2 diabetes mellitus with hyperglycemia: Secondary | ICD-10-CM | POA: Diagnosis not present

## 2023-01-05 DIAGNOSIS — M810 Age-related osteoporosis without current pathological fracture: Secondary | ICD-10-CM | POA: Diagnosis not present

## 2023-01-05 DIAGNOSIS — I10 Essential (primary) hypertension: Secondary | ICD-10-CM | POA: Diagnosis not present

## 2023-01-05 DIAGNOSIS — D510 Vitamin B12 deficiency anemia due to intrinsic factor deficiency: Secondary | ICD-10-CM | POA: Diagnosis not present

## 2023-01-05 DIAGNOSIS — E7849 Other hyperlipidemia: Secondary | ICD-10-CM | POA: Diagnosis not present

## 2023-01-06 DIAGNOSIS — M47816 Spondylosis without myelopathy or radiculopathy, lumbar region: Secondary | ICD-10-CM | POA: Diagnosis not present

## 2023-01-06 DIAGNOSIS — M9903 Segmental and somatic dysfunction of lumbar region: Secondary | ICD-10-CM | POA: Diagnosis not present

## 2023-01-08 DIAGNOSIS — M9903 Segmental and somatic dysfunction of lumbar region: Secondary | ICD-10-CM | POA: Diagnosis not present

## 2023-01-08 DIAGNOSIS — M47816 Spondylosis without myelopathy or radiculopathy, lumbar region: Secondary | ICD-10-CM | POA: Diagnosis not present

## 2023-01-18 DIAGNOSIS — M810 Age-related osteoporosis without current pathological fracture: Secondary | ICD-10-CM | POA: Diagnosis not present

## 2023-01-18 DIAGNOSIS — Z8262 Family history of osteoporosis: Secondary | ICD-10-CM | POA: Diagnosis not present

## 2023-04-21 DIAGNOSIS — Z85828 Personal history of other malignant neoplasm of skin: Secondary | ICD-10-CM | POA: Diagnosis not present

## 2023-04-21 DIAGNOSIS — L570 Actinic keratosis: Secondary | ICD-10-CM | POA: Diagnosis not present

## 2023-04-21 DIAGNOSIS — L821 Other seborrheic keratosis: Secondary | ICD-10-CM | POA: Diagnosis not present

## 2023-05-07 DIAGNOSIS — I10 Essential (primary) hypertension: Secondary | ICD-10-CM | POA: Diagnosis not present

## 2023-05-07 DIAGNOSIS — E1165 Type 2 diabetes mellitus with hyperglycemia: Secondary | ICD-10-CM | POA: Diagnosis not present

## 2023-05-07 DIAGNOSIS — E7849 Other hyperlipidemia: Secondary | ICD-10-CM | POA: Diagnosis not present

## 2023-05-07 DIAGNOSIS — R5383 Other fatigue: Secondary | ICD-10-CM | POA: Diagnosis not present

## 2023-05-07 DIAGNOSIS — Z1321 Encounter for screening for nutritional disorder: Secondary | ICD-10-CM | POA: Diagnosis not present

## 2023-05-07 DIAGNOSIS — D529 Folate deficiency anemia, unspecified: Secondary | ICD-10-CM | POA: Diagnosis not present

## 2023-05-07 DIAGNOSIS — K2100 Gastro-esophageal reflux disease with esophagitis, without bleeding: Secondary | ICD-10-CM | POA: Diagnosis not present

## 2023-05-12 DIAGNOSIS — Z6826 Body mass index (BMI) 26.0-26.9, adult: Secondary | ICD-10-CM | POA: Diagnosis not present

## 2023-05-12 DIAGNOSIS — I10 Essential (primary) hypertension: Secondary | ICD-10-CM | POA: Diagnosis not present

## 2023-05-12 DIAGNOSIS — K2100 Gastro-esophageal reflux disease with esophagitis, without bleeding: Secondary | ICD-10-CM | POA: Diagnosis not present

## 2023-05-12 DIAGNOSIS — E1165 Type 2 diabetes mellitus with hyperglycemia: Secondary | ICD-10-CM | POA: Diagnosis not present

## 2023-05-12 DIAGNOSIS — E782 Mixed hyperlipidemia: Secondary | ICD-10-CM | POA: Diagnosis not present

## 2023-05-12 DIAGNOSIS — Z23 Encounter for immunization: Secondary | ICD-10-CM | POA: Diagnosis not present

## 2023-05-12 DIAGNOSIS — Z0000 Encounter for general adult medical examination without abnormal findings: Secondary | ICD-10-CM | POA: Diagnosis not present

## 2023-05-12 DIAGNOSIS — Z1389 Encounter for screening for other disorder: Secondary | ICD-10-CM | POA: Diagnosis not present

## 2023-05-12 DIAGNOSIS — Z0001 Encounter for general adult medical examination with abnormal findings: Secondary | ICD-10-CM | POA: Diagnosis not present

## 2023-05-12 DIAGNOSIS — Z1331 Encounter for screening for depression: Secondary | ICD-10-CM | POA: Diagnosis not present

## 2023-06-21 ENCOUNTER — Other Ambulatory Visit: Payer: Self-pay

## 2023-06-21 DIAGNOSIS — I6523 Occlusion and stenosis of bilateral carotid arteries: Secondary | ICD-10-CM

## 2023-06-22 ENCOUNTER — Ambulatory Visit (INDEPENDENT_AMBULATORY_CARE_PROVIDER_SITE_OTHER)

## 2023-06-22 ENCOUNTER — Ambulatory Visit

## 2023-06-22 ENCOUNTER — Ambulatory Visit (INDEPENDENT_AMBULATORY_CARE_PROVIDER_SITE_OTHER): Admitting: Physician Assistant

## 2023-06-22 ENCOUNTER — Ambulatory Visit: Payer: Medicare Other

## 2023-06-22 DIAGNOSIS — I6523 Occlusion and stenosis of bilateral carotid arteries: Secondary | ICD-10-CM | POA: Diagnosis not present

## 2023-06-22 NOTE — Progress Notes (Addendum)
 Office Note     CC:  follow up Requesting Provider:  Leesa Pulling, MD  HPI: Terrance Brown is a 83 y.o. (September 11, 1940) male who presents for surveillance of carotid artery stenosis.  He is status post right carotid endarterectomy for asymptomatic stenosis on 01/30/2013 by Dr. Nolene Baumgarten.  He was last seen in the office a year ago.  He denies any diagnosis of CVA or TIA since last time.  He has also been without any slurred speech, changes in vision, or one-sided weakness.  He is on a daily aspirin and statin.  He follows regularly with his PCP for management of chronic medical conditions including hypertension, hyperlipidemia, and diabetes mellitus.  He is a former smoker.  He is anxious today due to plans to move his wife from a rehab facility to a nursing facility.   Past Medical History:  Diagnosis Date   Arthritis    Cancer (HCC)    skin CA removed from neck,ear, back, hand   Carotid artery occlusion    Diabetes mellitus without complication (HCC)    Erectile dysfunction    HOH (hard of hearing)    Hyperlipidemia    Hypertension    Insomnia     Past Surgical History:  Procedure Laterality Date   COLONOSCOPY     ENDARTERECTOMY Right 01/30/2013   Procedure: ENDARTERECTOMY CAROTID-RIGHT;  Surgeon: Richrd Char, MD;  Location: [facility] OR;  Service: Vascular;  Laterality: Right;   HEMORRHOID SURGERY     PATCH ANGIOPLASTY Right 01/30/2013   Procedure: RIGHT CAROTID ARTERY PATCH ANGIOPLASTY USING HEMASHIELD PATCH;  Surgeon: Richrd Char, MD;  Location: [facility] OR;  Service: Vascular;  Laterality: Right;   TONSILLECTOMY     as a child    Social History   Socioeconomic History   Marital status: Married    Spouse name: Not on file   Number of children: Not on file   Years of education: Not on file   Highest education level: Not on file  Occupational History   Not on file  Tobacco Use   Smoking status: Former    Current packs/day: 0.00    Types: Cigarettes    Quit date:  01/19/1993    Years since quitting: 30.4    Passive exposure: Never   Smokeless tobacco: Never  Substance and Sexual Activity   Alcohol use: Yes    Alcohol/week: 16.0 - 20.0 standard drinks of alcohol    Types: 2 - 3 Glasses of wine, 2 - 3 Cans of beer, 12 - 14 Shots of liquor per week    Comment: weekly   Drug use: No   Sexual activity: Not on file  Other Topics Concern   Not on file  Social History Narrative   Not on file   Social Drivers of Health   Financial Resource Strain: Not on file  Food Insecurity: Not on file  Transportation Needs: Not on file  Physical Activity: Not on file  Stress: Not on file  Social Connections: Not on file  Intimate Partner Violence: Not on file    Family History  Problem Relation Age of Onset   Diabetes Father    Heart disease Father    Hyperlipidemia Father    Heart attack Father    Hypertension Father     Current Outpatient Medications  Medication Sig Dispense Refill   alendronate (FOSAMAX) 70 MG tablet Take 70 mg by mouth once a week. Take with a full glass of water on an empty  stomach on Fridays (Patient not taking: Reported on 06/22/2023)     aspirin EC 81 MG tablet Take 81 mg by mouth daily.     atorvastatin (LIPITOR) 20 MG tablet Take 20 mg by mouth daily.     cholecalciferol (VITAMIN D) 1000 UNITS tablet Take 1,000 Units by mouth daily. Reported on 09/26/2015     lisinopril-hydrochlorothiazide (PRINZIDE,ZESTORETIC) 10-12.5 MG per tablet Take 1 tablet by mouth daily.     lovastatin (MEVACOR) 20 MG tablet Take 40 mg by mouth daily after supper. (Patient not taking: Reported on 06/22/2023)     Multiple Vitamin (MULTIVITAMIN WITH MINERALS) TABS tablet Take 1 tablet by mouth daily.     naproxen sodium (ALEVE) 220 MG tablet Take 220 mg by mouth daily. (Patient not taking: Reported on 06/22/2023)     oxyCODONE-acetaminophen (ROXICET) 5-325 MG per tablet Take 1-2 tablets by mouth every 4 (four) hours as needed for severe pain. (Patient not  taking: Reported on 06/22/2023) 30 tablet 0   sildenafil (VIAGRA) 100 MG tablet Take 100 mg by mouth daily as needed for erectile dysfunction. (Patient not taking: Reported on 06/22/2023)     zolpidem (AMBIEN) 10 MG tablet Take 10 mg by mouth at bedtime.      No current facility-administered medications for this visit.    Allergies  Allergen Reactions   Bupropion Palpitations     REVIEW OF SYSTEMS:   [X]  denotes positive finding, [ ]  denotes negative finding Cardiac  Comments:  Chest pain or chest pressure:    Shortness of breath upon exertion:    Short of breath when lying flat:    Irregular heart rhythm:        Vascular    Pain in calf, thigh, or hip brought on by ambulation:    Pain in feet at night that wakes you up from your sleep:     Blood clot in your veins:    Leg swelling:         Pulmonary    Oxygen at home:    Productive cough:     Wheezing:         Neurologic    Sudden weakness in arms or legs:     Sudden numbness in arms or legs:     Sudden onset of difficulty speaking or slurred speech:    Temporary loss of vision in one eye:     Problems with dizziness:         Gastrointestinal    Blood in stool:     Vomited blood:         Genitourinary    Burning when urinating:     Blood in urine:        Psychiatric    Major depression:         Hematologic    Bleeding problems:    Problems with blood clotting too easily:        Skin    Rashes or ulcers:        Constitutional    Fever or chills:      PHYSICAL EXAMINATION:  There were no vitals filed for this visit.  General:  WDWN in NAD; vital signs documented above Gait: Not observed HENT: WNL, normocephalic Pulmonary: normal non-labored breathing , without Rales, rhonchi,  wheezing Cardiac: regular HR Abdomen: soft, NT, no masses Skin: without rashes Vascular Exam/Pulses: Symmetrical radial pulses Extremities: without ischemic changes, without Gangrene , without cellulitis; without open  wounds;  Musculoskeletal: no muscle wasting or atrophy  Neurologic: A&O X 3; CN grossly intact Psychiatric:  The pt has Normal affect.   Non-Invasive Vascular Imaging:   Right ICA 1 to 39% stenosis Left ICA 1 to 39% stenosis    ASSESSMENT/PLAN:: 83 y.o. male here for follow up for surveillance of carotid artery stenosis with history of right carotid endarterectomy  Subjectively, Mr. Banks has been doing well since last office visit.  He denies any diagnosis of TIA or CVA.  Carotid duplex is unchanged compared to study 1 year ago.  1 to 39% stenosis of bilateral internal carotid arteries.  He will continue his aspirin and statin daily.  We will repeat carotid duplex in another year.  He will continue to follow-up regularly with his PCP for management of chronic medical conditions.   Cordie Deters, PA-C [facility] [phone]

## 2023-09-23 DIAGNOSIS — R0602 Shortness of breath: Secondary | ICD-10-CM | POA: Diagnosis not present

## 2023-09-23 DIAGNOSIS — Z6825 Body mass index (BMI) 25.0-25.9, adult: Secondary | ICD-10-CM | POA: Diagnosis not present

## 2023-09-23 DIAGNOSIS — Z1329 Encounter for screening for other suspected endocrine disorder: Secondary | ICD-10-CM | POA: Diagnosis not present

## 2023-09-23 DIAGNOSIS — Z1321 Encounter for screening for nutritional disorder: Secondary | ICD-10-CM | POA: Diagnosis not present

## 2023-09-23 DIAGNOSIS — R5383 Other fatigue: Secondary | ICD-10-CM | POA: Diagnosis not present

## 2023-10-26 DIAGNOSIS — L570 Actinic keratosis: Secondary | ICD-10-CM | POA: Diagnosis not present

## 2023-10-26 DIAGNOSIS — D485 Neoplasm of uncertain behavior of skin: Secondary | ICD-10-CM | POA: Diagnosis not present

## 2023-10-26 DIAGNOSIS — C44619 Basal cell carcinoma of skin of left upper limb, including shoulder: Secondary | ICD-10-CM | POA: Diagnosis not present

## 2023-11-04 DIAGNOSIS — C44619 Basal cell carcinoma of skin of left upper limb, including shoulder: Secondary | ICD-10-CM | POA: Diagnosis not present

## 2023-11-16 DIAGNOSIS — E1165 Type 2 diabetes mellitus with hyperglycemia: Secondary | ICD-10-CM | POA: Diagnosis not present

## 2023-11-16 DIAGNOSIS — I10 Essential (primary) hypertension: Secondary | ICD-10-CM | POA: Diagnosis not present

## 2023-11-16 DIAGNOSIS — Z1321 Encounter for screening for nutritional disorder: Secondary | ICD-10-CM | POA: Diagnosis not present

## 2023-11-16 DIAGNOSIS — Z0001 Encounter for general adult medical examination with abnormal findings: Secondary | ICD-10-CM | POA: Diagnosis not present

## 2023-11-16 DIAGNOSIS — E7849 Other hyperlipidemia: Secondary | ICD-10-CM | POA: Diagnosis not present

## 2023-11-16 DIAGNOSIS — R42 Dizziness and giddiness: Secondary | ICD-10-CM | POA: Diagnosis not present

## 2023-11-23 DIAGNOSIS — E7849 Other hyperlipidemia: Secondary | ICD-10-CM | POA: Diagnosis not present

## 2023-11-23 DIAGNOSIS — I10 Essential (primary) hypertension: Secondary | ICD-10-CM | POA: Diagnosis not present

## 2023-11-23 DIAGNOSIS — K2100 Gastro-esophageal reflux disease with esophagitis, without bleeding: Secondary | ICD-10-CM | POA: Diagnosis not present

## 2023-11-23 DIAGNOSIS — Z23 Encounter for immunization: Secondary | ICD-10-CM | POA: Diagnosis not present

## 2023-11-23 DIAGNOSIS — E782 Mixed hyperlipidemia: Secondary | ICD-10-CM | POA: Diagnosis not present

## 2023-11-23 DIAGNOSIS — E559 Vitamin D deficiency, unspecified: Secondary | ICD-10-CM | POA: Diagnosis not present

## 2023-11-23 DIAGNOSIS — Z6826 Body mass index (BMI) 26.0-26.9, adult: Secondary | ICD-10-CM | POA: Diagnosis not present

## 2023-12-03 DIAGNOSIS — Z23 Encounter for immunization: Secondary | ICD-10-CM | POA: Diagnosis not present
